# Patient Record
Sex: Male | Born: 1995 | Race: Black or African American | Hispanic: No | Smoking: Never smoker
Health system: Southern US, Community
[De-identification: ages and names within clinical notes are randomized; demographics above are authoritative.]

## PROBLEM LIST (undated history)

## (undated) HISTORY — PX: NO PAST SURGERIES: SHX2092

---
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
## 2019-02-23 ENCOUNTER — Ambulatory Visit (INDEPENDENT_AMBULATORY_CARE_PROVIDER_SITE_OTHER): Payer: BC Managed Care – PPO | Admitting: Family Medicine

## 2019-02-23 ENCOUNTER — Other Ambulatory Visit: Payer: Self-pay

## 2019-02-23 ENCOUNTER — Encounter: Payer: Self-pay | Admitting: Family Medicine

## 2019-02-23 VITALS — BP 120/90 | HR 61 | Temp 97.5°F | Ht 70.5 in | Wt 183.6 lb

## 2019-02-23 DIAGNOSIS — F4321 Adjustment disorder with depressed mood: Secondary | ICD-10-CM | POA: Insufficient documentation

## 2019-02-23 DIAGNOSIS — Z113 Encounter for screening for infections with a predominantly sexual mode of transmission: Secondary | ICD-10-CM

## 2019-02-23 DIAGNOSIS — Z0000 Encounter for general adult medical examination without abnormal findings: Secondary | ICD-10-CM

## 2019-02-23 DIAGNOSIS — Z13220 Encounter for screening for lipoid disorders: Secondary | ICD-10-CM

## 2019-02-23 DIAGNOSIS — G479 Sleep disorder, unspecified: Secondary | ICD-10-CM | POA: Insufficient documentation

## 2019-02-23 DIAGNOSIS — Z1329 Encounter for screening for other suspected endocrine disorder: Secondary | ICD-10-CM | POA: Diagnosis not present

## 2019-02-23 NOTE — Patient Instructions (Addendum)
It was a pleasure meeting you today.  Try over-the-counter melatonin as opposed to other sleep aids. Make sure the room you sleep and is dark, no lights on and cool. Try to avoid watching TV or reading your phone while laying in bed. If you go to bed and cannot fall asleep within 30 minutes then I recommend that you get up and do something until you are sleepy. Try to avoid caffeine or exercise right before bedtime  I recommend that you schedule with a therapist. You may call and schedule with anyone but make sure they are in your insurance network.  Try to eat small frequent meals and avoid skipping meals. Stay well-hydrated.  Start exercising again.  It is recommended that you get at least 150 minutes of vigorous physical activity each week.  We will be in touch with your lab results.  4-week follow-up virtually or in office.  You can call to schedule your appointment with a counselor. A few offices are listed below for you to call.   [facility] [address] [place], [place] [address]  Phone: [place]  (across from [facility])  [facility] [address] [place], [place] [address] [place], [place],  [address]  Phone: [phone]       Preventive Care 59-66 Years Old, Male Preventive care refers to lifestyle choices and visits with your health care provider that can promote health and wellness. This includes:  A yearly physical exam. This is also called an annual well check.  Regular dental and eye exams.  Immunizations.  Screening for certain conditions.  Healthy lifestyle choices, such as eating a healthy diet, getting regular exercise, not using drugs or products that contain nicotine and tobacco, and limiting alcohol use.  What can I expect for my preventive care visit? Physical exam Your health care provider will check:  Height and weight. These may be used to calculate body mass index (BMI), which is a measurement that tells if you are at a healthy weight.  Heart rate and blood pressure.  Your skin for abnormal spots. Counseling Your health care provider may ask you questions about:  Alcohol, tobacco, and drug use.  Emotional well-being.  Home and relationship well-being.  Sexual activity.  Eating habits.  Work and work Statistician. What immunizations do I need?  Influenza (flu) vaccine  This is recommended every year. Tetanus, diphtheria, and pertussis (Tdap) vaccine  You may need a Td booster every 10 years. Varicella (chickenpox) vaccine  You may need this vaccine if you have not already been vaccinated. Human papillomavirus (HPV) vaccine  If recommended by your health care provider, you may need three doses over 6 months. Measles, mumps, and rubella (MMR) vaccine  You may need at least one dose of MMR. You may also need a second dose. Meningococcal conjugate (MenACWY) vaccine  One dose is recommended if you are 27-75 years old and a Market researcher living in a residence hall, or if you have one of several medical conditions. You may also need additional booster doses. Pneumococcal conjugate (PCV13) vaccine  You may need this if you have certain conditions and were not previously vaccinated. Pneumococcal polysaccharide (PPSV23) vaccine  You may need one or two doses if you smoke cigarettes or if  you have certain conditions. Hepatitis A vaccine  You may need this if you have certain conditions or if you travel or work in places where you may be exposed to hepatitis A. Hepatitis B vaccine  You may need this if you have certain conditions or if you travel or work in places where you may be exposed to hepatitis B. Haemophilus influenzae type b (Hib) vaccine  You may  need this if you have certain risk factors. You may receive vaccines as individual doses or as more than one vaccine together in one shot (combination vaccines). Talk with your health care provider about the risks and benefits of combination vaccines. What tests do I need? Blood tests  Lipid and cholesterol levels. These may be checked every 5 years starting at age 61.  Hepatitis C test.  Hepatitis B test. Screening   Diabetes screening. This is done by checking your blood sugar (glucose) after you have not eaten for a while (fasting).  Sexually transmitted disease (STD) testing. Talk with your health care provider about your test results, treatment options, and if necessary, the need for more tests. Follow these instructions at home: Eating and drinking   Eat a diet that includes fresh fruits and vegetables, whole grains, lean protein, and low-fat dairy products.  Take vitamin and mineral supplements as recommended by your health care provider.  Do not drink alcohol if your health care provider tells you not to drink.  If you drink alcohol: ? Limit how much you have to 0-2 drinks a day. ? Be aware of how much alcohol is in your drink. In the [place] one drink equals one 12 oz bottle of beer (355 mL), one 5 oz glass of wine (148 mL), or one 1 oz glass of hard liquor (44 mL). Lifestyle  Take daily care of your teeth and gums.  Stay active. Exercise for at least 30 minutes on 5 or more days each week.  Do not use any products that contain nicotine or tobacco, such as cigarettes, e-cigarettes, and chewing tobacco. If you need help quitting, ask your health care provider.  If you are sexually active, practice safe sex. Use a condom or other form of protection to prevent STIs (sexually transmitted infections). What's next?  Go to your health care provider once a year for a well check visit.  Ask your health care provider how often you should have your eyes and teeth checked.   Stay up to date on all vaccines. This information is not intended to replace advice given to you by your health care provider. Make sure you discuss any questions you have with your health care provider. Document Released: 04/29/2001 Document Revised: 02/25/2018 Document Reviewed: 02/25/2018 Elsevier Patient Education  2020 [employer].

## 2019-02-23 NOTE — Progress Notes (Signed)
Subjective:    Patient ID: Earl Allen, male    DOB: 1996/01/18, 23 y.o.   MRN: [id number]  HPI Chief Complaint  Patient presents with  . new pt    new pt, cpe. sometimes wake up depressed and having nightmares    He is new to the practice and here for a complete physical exam. Moved from [place]. Previously was in college at [employer] and played football there.  States he had sports physicals annually.   States he talks loudly and his girlfriend would like for him to have his hearing checked. He denies any issues with hearing.   States he started a new job right after college and did not like it. 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 Switched shifts every 2 eweks. States he was sleep deprived regularly.  He left that job in August. 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 Still has long hours and continues having bad days. States he has days where he feels depressed.  Still has issues sleeping some nights and takes "sleep aids".   He has not been involved in counseling.  Denies SI.  Denies self medicating with alcohol or drugs.  No history of addiction.   States he has not had any regular exercise since finishing college and he misses this. Diet is not very healthy. Fast food often.    He has a 13 month old and a dog.    Social history: Lives with girlfriend who is staring nursing school and his 74 month old son. His family lives in [place].  Denies smoking and drug use.   Immunizations: Tdap UTD. Declines flu shot   Health maintenance:  Last Dental Exam: last week  Last Eye Exam: March 2020. Wears glasses   Depression screen PHQ 2/9 02/23/2019  Decreased Interest 2  Down, Depressed, Hopeless 2  PHQ - 2 Score 4  Altered sleeping 3  Tired, decreased energy 3  Change in appetite 3  Feeling bad or failure about yourself  3  Trouble concentrating 3  Moving slowly or fidgety/restless 2   Suicidal thoughts 0  PHQ-9 Score 21  Difficult doing work/chores Somewhat difficult     Wears seatbelt always, smoke detectors in home and functioning, does not text while driving, feels safe in home environment.  Reviewed allergies, medications, past medical, surgical, family, and social history.   Review of Systems Review of Systems Constitutional: -fever, -chills, -sweats, -unexpected weight change,-fatigue ENT: -runny nose, -ear pain, -sore throat Cardiology:  -chest pain, -palpitations, -edema Respiratory: -cough, -shortness of breath, -wheezing Gastroenterology: -abdominal pain, -nausea, -vomiting, -diarrhea, -constipation  Hematology: -bleeding or bruising problems Musculoskeletal: -arthralgias, -myalgias, -joint swelling, -back pain Ophthalmology: -vision changes Urology: -dysuria, -difficulty urinating, -hematuria, -urinary frequency, -urgency Neurology: -headache, -weakness, -tingling, -numbness       Objective:   Physical Exam BP 120/90   Pulse 61   Temp (!) 97.5 F (36.4 C)   Ht 5' 10.5" (1.791 m)   Wt 183 lb 9.6 oz (83.3 kg)   BMI 25.97 kg/m   General Appearance:    Alert, cooperative, no distress, appears stated age  Head:    Normocephalic, without obvious abnormality, atraumatic  Eyes:    PERRL, conjunctiva/corneas clear, EOM's intact  Ears:    Normal TM's and external ear canals  Nose:   Mask in place   Throat:   Mask in place   Neck:   Supple, no lymphadenopathy;  thyroid:  no   enlargement/tenderness/nodules  Back:    Spine nontender, no curvature, ROM normal, no CVA     tenderness  Lungs:     Clear to auscultation bilaterally without wheezes, rales or     ronchi; respirations unlabored  Chest Wall:    No tenderness or deformity   Heart:    Regular rate and rhythm, S1 and S2 normal, no murmur, rub   or gallop  Breast Exam:    No chest wall tenderness, masses or gynecomastia  Abdomen:     Soft, non-tender, nondistended, normoactive bowel sounds,     no masses, no hepatosplenomegaly  Genitalia:    Normal male external genitalia without lesions.  Testicles without masses.  No inguinal hernias.  Rectal:   Deferred due to age <40 and lack of symptoms  Extremities:   No clubbing, cyanosis or edema  Pulses:   2+ and symmetric all extremities  Skin:   Skin color, texture, turgor normal, no rashes or lesions  Lymph nodes:   Cervical, supraclavicular, and axillary nodes normal  Neurologic:   CNII-XII intact, normal strength, sensation and gait; reflexes 2+ and symmetric throughout          Psych:   Normal mood, affect, hygiene and grooming.         Assessment & Plan:  Routine general medical examination at a health care facility - Plan: CBC with Differential, Comprehensive metabolic panel, TSH, T4, Free, Lipid Panel -He is new to the practice and here for a fasting CPE.  Discussed preventive healthcare including regular testicular exams.  He has no significant past medical or surgical history.  Immunizations reviewed he declines flu shot.  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  He does report enjoying his current job more than his last one at the detention center but he does work long hours and reports being stressed and having difficulty sleeping.  He denies SI.  Recommend getting involved in counseling.  Also recommend that he do some thinking about his next career move and what he would need to do in order to achieve this.  Sleep disturbance-recommend he avoid sleep aids that leave him feeling drowsy next morning.  He may try melatonin.  Also recommend discussing sleep issues with a counselor.  He is aware that he should call and schedule this.  Screen for STD (sexually transmitted disease) - Plan: RPR, HIV antibody (with reflex), GC/Chlamydia Probe Amp, Trichomonas vaginalis, RNA   Screening for thyroid disorder - Plan: TSH, T4, Free  Screening for lipid disorders - Plan: Lipid Panel

## 2019-02-24 LAB — CBC WITH DIFFERENTIAL/PLATELET
Basophils Absolute: 0 10*3/uL (ref 0.0–0.2)
Basos: 1 %
EOS (ABSOLUTE): 0.1 10*3/uL (ref 0.0–0.4)
Eos: 2 %
Hematocrit: 46.2 % (ref 37.5–51.0)
Hemoglobin: 16 g/dL (ref 13.0–17.7)
Immature Grans (Abs): 0 10*3/uL (ref 0.0–0.1)
Immature Granulocytes: 0 %
Lymphocytes Absolute: 1.4 10*3/uL (ref 0.7–3.1)
Lymphs: 31 %
MCH: 32.1 pg (ref 26.6–33.0)
MCHC: 34.6 g/dL (ref 31.5–35.7)
MCV: 93 fL (ref 79–97)
Monocytes Absolute: 0.3 10*3/uL (ref 0.1–0.9)
Monocytes: 7 %
Neutrophils Absolute: 2.6 10*3/uL (ref 1.4–7.0)
Neutrophils: 59 %
Platelets: 264 10*3/uL (ref 150–450)
RBC: 4.98 x10E6/uL (ref 4.14–5.80)
RDW: 12 % (ref 11.6–15.4)
WBC: 4.5 10*3/uL (ref 3.4–10.8)

## 2019-02-24 LAB — COMPREHENSIVE METABOLIC PANEL
ALT: 37 IU/L (ref 0–44)
AST: 66 IU/L — ABNORMAL HIGH (ref 0–40)
Albumin/Globulin Ratio: 2 (ref 1.2–2.2)
Albumin: 4.9 g/dL (ref 4.1–5.2)
Alkaline Phosphatase: 49 IU/L (ref 39–117)
BUN/Creatinine Ratio: 12 (ref 9–20)
BUN: 15 mg/dL (ref 6–20)
Bilirubin Total: 0.9 mg/dL (ref 0.0–1.2)
CO2: 24 mmol/L (ref 20–29)
Calcium: 10.6 mg/dL — ABNORMAL HIGH (ref 8.7–10.2)
Chloride: 101 mmol/L (ref 96–106)
Creatinine, Ser: 1.26 mg/dL (ref 0.76–1.27)
GFR calc Af Amer: 93 mL/min/{1.73_m2} (ref >59)
GFR calc non Af Amer: 80 mL/min/{1.73_m2} (ref >59)
Globulin, Total: 2.5 g/dL (ref 1.5–4.5)
Glucose: 83 mg/dL (ref 65–99)
Potassium: 4.1 mmol/L (ref 3.5–5.2)
Sodium: 140 mmol/L (ref 134–144)
Total Protein: 7.4 g/dL (ref 6.0–8.5)

## 2019-02-24 LAB — T4, FREE: Free T4: 1.22 ng/dL (ref 0.82–1.77)

## 2019-02-24 LAB — LIPID PANEL
Chol/HDL Ratio: 3 ratio (ref 0.0–5.0)
Cholesterol, Total: 179 mg/dL (ref 100–199)
HDL: 60 mg/dL (ref >39)
LDL Chol Calc (NIH): 103 mg/dL — ABNORMAL HIGH (ref 0–99)
Triglycerides: 86 mg/dL (ref 0–149)
VLDL Cholesterol Cal: 16 mg/dL (ref 5–40)

## 2019-02-24 LAB — HIV ANTIBODY (ROUTINE TESTING W REFLEX): HIV Screen 4th Generation wRfx: NONREACTIVE

## 2019-02-24 LAB — RPR: RPR Ser Ql: NONREACTIVE

## 2019-02-24 LAB — TSH: TSH: 0.496 u[IU]/mL (ref 0.450–4.500)

## 2019-02-25 LAB — GC/CHLAMYDIA PROBE AMP
Chlamydia trachomatis, NAA: NEGATIVE
Neisseria Gonorrhoeae by PCR: NEGATIVE

## 2019-02-25 LAB — TRICHOMONAS VAGINALIS, PROBE AMP: Trich vag by NAA: NEGATIVE

## 2019-02-28 ENCOUNTER — Other Ambulatory Visit: Payer: Self-pay | Admitting: Family Medicine

## 2019-02-28 DIAGNOSIS — R7401 Elevation of levels of liver transaminase levels: Secondary | ICD-10-CM

## 2019-02-28 NOTE — Progress Notes (Signed)
Let him know that one of his liver enzymes is mildly elevated and no sign of acute hepatitis. I recommend we recheck his liver function in 2-3 weeks. This can be a NON-fasting lab visit. I recommend he stop all alcohol use and NSAIDs such as ibuprofen, Aleve, Advil etc. His calcium level is also a little elevated and we will recheck this. His LDL or bad cholesterol is a little high too. I recommend that he drink at least 64 ounces of water throughout the day (not at one time) and cut back on fried food, fatty foods. Increase his physical activity as we discussed. Otherwise his blood work is ok and he is negative for STDs.

## 2019-03-18 LAB — SPECIMEN STATUS REPORT

## 2019-03-18 LAB — HEPATITIS PANEL, ACUTE
Hep A IgM: NEGATIVE
Hep B C IgM: NEGATIVE
Hep C Virus Ab: <0.1 s/co ratio (ref 0.0–0.9)
Hepatitis B Surface Ag: NEGATIVE

## 2019-03-22 ENCOUNTER — Other Ambulatory Visit: Payer: Self-pay

## 2019-03-22 ENCOUNTER — Other Ambulatory Visit (INDEPENDENT_AMBULATORY_CARE_PROVIDER_SITE_OTHER): Payer: BC Managed Care – PPO

## 2019-03-22 DIAGNOSIS — R7401 Elevation of levels of liver transaminase levels: Secondary | ICD-10-CM

## 2019-03-23 ENCOUNTER — Encounter: Payer: Self-pay | Admitting: Family Medicine

## 2019-03-23 ENCOUNTER — Ambulatory Visit (INDEPENDENT_AMBULATORY_CARE_PROVIDER_SITE_OTHER): Payer: BC Managed Care – PPO | Admitting: Family Medicine

## 2019-03-23 VITALS — Wt 185.0 lb

## 2019-03-23 DIAGNOSIS — F4321 Adjustment disorder with depressed mood: Secondary | ICD-10-CM

## 2019-03-23 LAB — COMPREHENSIVE METABOLIC PANEL
ALT: 22 IU/L (ref 0–44)
AST: 17 IU/L (ref 0–40)
Albumin/Globulin Ratio: 2 (ref 1.2–2.2)
Albumin: 4.8 g/dL (ref 4.1–5.2)
Alkaline Phosphatase: 49 IU/L (ref 39–117)
BUN/Creatinine Ratio: 15 (ref 9–20)
BUN: 18 mg/dL (ref 6–20)
Bilirubin Total: 0.4 mg/dL (ref 0.0–1.2)
CO2: 24 mmol/L (ref 20–29)
Calcium: 10.4 mg/dL — ABNORMAL HIGH (ref 8.7–10.2)
Chloride: 102 mmol/L (ref 96–106)
Creatinine, Ser: 1.22 mg/dL (ref 0.76–1.27)
GFR calc Af Amer: 97 mL/min/{1.73_m2} (ref >59)
GFR calc non Af Amer: 84 mL/min/{1.73_m2} (ref >59)
Globulin, Total: 2.4 g/dL (ref 1.5–4.5)
Glucose: 86 mg/dL (ref 65–99)
Potassium: 4.2 mmol/L (ref 3.5–5.2)
Sodium: 141 mmol/L (ref 134–144)
Total Protein: 7.2 g/dL (ref 6.0–8.5)

## 2019-03-23 NOTE — Progress Notes (Signed)
   Subjective:  Documentation for virtual audio and video telecommunications through [employer] encounter:  The patient was located at home. 2 patient identifiers used.  The provider was located in the office. The patient did consent to this visit and is aware of possible charges through their insurance for this visit.  The other persons participating in this telemedicine service were none.    Patient ID: Earl Allen, male    DOB: [birth year], 24 y.o.   MRN: [id number]  HPI Chief Complaint  Patient presents with  . depression    depression.    This is a 4-wk follow up on depression and sleep issues.  States he is doing much better.  He has been exercising and stopped alcohol use.  He is still interested in scheduling with a therapist but has not yet. States he started taking a multivitamin.  No new concerns today.  Denies fever, chills, headache, chest pain, palpitations, shortness of breath, abdominal pain, nausea, vomiting, diarrhea.   Review of Systems Pertinent positives and negatives in the history of present illness.     Objective:   Physical Exam Wt 185 lb (83.9 kg)   BMI 26.17 kg/m   Alert and oriented and in no acute distress.  Normal speech, mood and thought process      Assessment & Plan:  Situational depression  He seems to be doing much better and is taking good care of himself.  I congratulated him on exercising and avoiding alcohol.  His AST was elevated at his initial visit but we rechecked this and it was back in normal range.  His calcium level was also elevated and closer to normal after repeat labs.  Recommend he continue on a multivitamin and exercising.  I do recommend that he schedule with a therapist.  I [facility] recommended [facility] or [facility] [phone].  He declines medication. He will follow-up in 3 months for recheck of calcium and depression or sooner if needed.  Time spent on call was 11  minutes and in review of previous records 15 minutes total.  This virtual service is not related to other E/M service within previous 7 days.

## 2019-06-27 ENCOUNTER — Ambulatory Visit: Payer: BC Managed Care – PPO

## 2019-06-27 ENCOUNTER — Ambulatory Visit: Payer: BC Managed Care – PPO | Admitting: Podiatry

## 2019-06-27 ENCOUNTER — Other Ambulatory Visit: Payer: Self-pay | Admitting: Podiatry

## 2019-06-27 ENCOUNTER — Ambulatory Visit (INDEPENDENT_AMBULATORY_CARE_PROVIDER_SITE_OTHER): Payer: BC Managed Care – PPO

## 2019-06-27 ENCOUNTER — Other Ambulatory Visit: Payer: Self-pay

## 2019-06-27 DIAGNOSIS — S82845A Nondisplaced bimalleolar fracture of left lower leg, initial encounter for closed fracture: Secondary | ICD-10-CM

## 2019-06-27 DIAGNOSIS — M25572 Pain in left ankle and joints of left foot: Secondary | ICD-10-CM

## 2019-06-27 MED ORDER — DICLOFENAC SODIUM 75 MG PO TBEC: 75.0000 mg | DELAYED_RELEASE_TABLET | Freq: Two times a day (BID) | ORAL | 1 refills | Status: DC

## 2019-06-28 ENCOUNTER — Telehealth: Payer: Self-pay

## 2019-06-28 ENCOUNTER — Telehealth: Payer: Self-pay | Admitting: *Deleted

## 2019-06-28 DIAGNOSIS — S82899A Other fracture of unspecified lower leg, initial encounter for closed fracture: Secondary | ICD-10-CM

## 2019-06-28 NOTE — Telephone Encounter (Signed)
Orders to Dr. Logan Bores' CMA for pre-cert, faxed to [facility].

## 2019-06-28 NOTE — Telephone Encounter (Signed)
Prior Authorization for MR Left Ankle obtained through [facility] online. Order #  [id number]. Faxed to [facility].

## 2019-06-28 NOTE — Telephone Encounter (Signed)
-----   Message from Felecia Shelling, DPM sent at 06/27/2019 10:46 AM EDT ----- Regarding: MRI left ankle Please order MRI left ankle w/out contrast.   Dx: chronic ankle fracture posterior talus. Neuritis. Possible PT tendon tear LT  Thanks, Dr. Logan Bores

## 2019-07-05 NOTE — Progress Notes (Signed)
   HPI: 24 y.o. male presenting today 24 year old male otherwise very healthy presents the office today for evaluation of left ankle pain.  Patient does have a history of an ankle sprain back in 2016 and recurrent sprain back in 2019.  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  He continues to experience pain and tenderness to the left ankle.  He has a shooting sensation that goes up his leg as well.  Aggravated by running and pressure.  He has been icing and doing physical therapy.  No past medical history on file.   Physical Exam: General: The patient is alert and oriented x3 in no acute distress.  Dermatology: Skin is warm, dry and supple bilateral lower extremities. Negative for open lesions or macerations.  Vascular: Palpable pedal pulses bilaterally. No edema or erythema noted. Capillary refill within normal limits.  Neurological: Epicritic and protective threshold grossly intact bilaterally.   Musculoskeletal Exam: Range of motion within normal limits to all pedal and ankle joints bilateral. Muscle strength 5/5 in all groups bilateral.  Pain on palpation noted to the anterior medial and lateral aspect of the patient's left ankle joint.  Pain with range of motion also noted at.  Radiographic Exam:  Normal osseous mineralization. Joint spaces preserved. No fracture/dislocation/boney destruction.    Assessment: 1. H/o left ankle sprain-2016 2. H/o recurrent left ankle sprain-2019 3.  Likely ruptured ATFL left ankle   Plan of Care:  1. Patient evaluated. X-Rays reviewed.  2.  The patient has had multiple recurrent injuries with consistent pain over the last year and a half.  Today we are going to order an MRI to determine the extent of the recurrent ankle sprains and damage to the ankle joint and associated ligaments 3.  Prescription for diclofenac  75 mg 2 times daily 4.  Return to clinic in 3 weeks to review MRI results  *Played college football at [employer]      Felecia Shelling, [place] [facility]  Dr. Felecia Shelling, DPM    [address] [place], [place] [address]                Office [phone]  Fax [phone]

## 2019-07-20 ENCOUNTER — Ambulatory Visit: Payer: BC Managed Care – PPO | Admitting: Podiatry

## 2019-07-22 ENCOUNTER — Ambulatory Visit
Admission: RE | Admit: 2019-07-22 | Discharge: 2019-07-22 | Disposition: A | Discharge status: Home / Self Care | Payer: BC Managed Care – PPO | Source: Ambulatory Visit | Attending: Podiatry | Admitting: Podiatry

## 2019-07-22 ENCOUNTER — Other Ambulatory Visit: Payer: Self-pay

## 2019-08-01 ENCOUNTER — Ambulatory Visit: Payer: BC Managed Care – PPO | Admitting: Podiatry

## 2019-08-01 ENCOUNTER — Encounter: Payer: Self-pay | Admitting: Podiatry

## 2019-08-01 ENCOUNTER — Other Ambulatory Visit: Payer: Self-pay

## 2019-08-01 DIAGNOSIS — M25572 Pain in left ankle and joints of left foot: Secondary | ICD-10-CM

## 2019-08-01 DIAGNOSIS — S82899A Other fracture of unspecified lower leg, initial encounter for closed fracture: Secondary | ICD-10-CM | POA: Diagnosis not present

## 2019-08-01 DIAGNOSIS — S92132K Displaced fracture of posterior process of left talus, subsequent encounter for fracture with nonunion: Secondary | ICD-10-CM

## 2019-08-01 NOTE — Patient Instructions (Signed)
Pre-Operative Instructions  Congratulations, you have decided to take an important step towards improving your quality of life.  You can be assured that the doctors and staff at [facility] will be with you every step of the way.  Here are some important things you should know:  1. Plan to be at the surgery center/hospital at least 1 (one) hour prior to your scheduled time, unless otherwise directed by the surgical center/hospital staff.  You must have a responsible adult accompany you, remain during the surgery and drive you home.  Make sure you have directions to the surgical center/hospital to ensure you arrive on time. 2. If you are having surgery at [facility] or [facility] hospitals, you will need a copy of your medical history and physical form from your family physician within one month prior to the date of surgery. We will give you a form for your primary physician to complete.  3. We make every effort to accommodate the date you request for surgery.  However, there are times where surgery dates or times have to be moved.  We will contact you as soon as possible if a change in schedule is required.   4. No aspirin/ibuprofen for one week before surgery.  If you are on aspirin, any non-steroidal anti-inflammatory medications (Mobic, Aleve, Ibuprofen) should not be taken seven (7) days prior to your surgery.  You make take Tylenol for pain prior to surgery.  5. Medications - If you are taking daily heart and blood pressure medications, seizure, reflux, allergy, asthma, anxiety, pain or diabetes medications, make sure you notify the surgery center/hospital before the day of surgery so they can tell you which medications you should take or avoid the day of surgery. 6. No food or drink after midnight the night before surgery unless directed otherwise by surgical center/hospital staff. 7. No alcoholic beverages 24-hours prior to surgery.  No smoking 24-hours prior or 24-hours after  surgery. 8. Wear loose pants or shorts. They should be loose enough to fit over bandages, boots, and casts. 9. Don't wear slip-on shoes. Sneakers are preferred. 10. Bring your boot with you to the surgery center/hospital.  Also bring crutches or a walker if your physician has prescribed it for you.  If you do not have this equipment, it will be provided for you after surgery. 11. If you have not been contacted by the surgery center/hospital by the day before your surgery, call to confirm the date and time of your surgery. 12. Leave-time from work may vary depending on the type of surgery you have.  Appropriate arrangements should be made prior to surgery with your employer. 13. Prescriptions will be provided immediately following surgery by your doctor.  Fill these as soon as possible after surgery and take the medication as directed. Pain medications will not be refilled on weekends and must be approved by the doctor. 14. Remove nail polish on the operative foot and avoid getting pedicures prior to surgery. 15. Wash the night before surgery.  The night before surgery wash the foot and leg well with water and the antibacterial soap provided. Be sure to pay special attention to beneath the toenails and in between the toes.  Wash for at least three (3) minutes. Rinse thoroughly with water and dry well with a towel.  Perform this wash unless told not to do so by your physician.  Enclosed: 1 Ice pack (please put in freezer the night before surgery)   1 Hibiclens skin cleaner     Pre-op instructions  If you have any questions regarding the instructions, please do not hesitate to call our office.  [place]: [address], [place], [place] [address] -- [phone]  [place]: [address] [place], [place] [address] -- [phone]  [place]: [address], [place], [place] [address] -- [phone]   Website: [email or web address] 

## 2019-08-02 ENCOUNTER — Telehealth: Payer: Self-pay

## 2019-08-02 NOTE — Telephone Encounter (Signed)
DOS 08/11/2019  TARSAL EXOSTECTOMY LT - 28104   BCBS EFFECTIVE DATE - 04/17/2019    In-Network   Max Per Benefit Period Year-to-Date Remaining     CoInsurance         Deductible $5,500.00 $5,493.98     Out-Of-Pocket 3 $6,850.00 $3,086.57  Copay Not Applicable Coinsurance 25%  per  Service Year Authorization Required No

## 2019-08-04 NOTE — Progress Notes (Signed)
   HPI: 24 y.o. male presenting today for follow up evaluation of left ankle pain. He states he is doing well overall. He reports some improvement in the pain since his last visit. He has been taking Diclofenac as directed. He had an MRI done of the ankle on 07/22/2019 and is here to get the results. Patient is here for further evaluation and treatment.  No past medical history on file.   Physical Exam: General: The patient is alert and oriented x3 in no acute distress.  Dermatology: Skin is warm, dry and supple bilateral lower extremities. Negative for open lesions or macerations.  Vascular: Palpable pedal pulses bilaterally. No edema or erythema noted. Capillary refill within normal limits.  Neurological: Epicritic and protective threshold grossly intact bilaterally.   Musculoskeletal Exam: Pain with palpation noted to the medial aspect of the left ankle correlating with MRI findings.   MRI Impression done on 07/22/2019:  1. 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  Assessment: 1. Shepard's fracture / os trigonum left posterior ankle    Plan of Care:  1. Patient evaluated. MRI reviewed.  2. Today we discussed the conservative versus surgical management of the presenting pathology. The patient opts for surgical management. All possible complications and details of the procedure were explained. All patient questions were answered. No guarantees were expressed or implied. 3. Authorization for surgery was initiated today. Surgery will consist of excision of os trigonum / Shepard's fracture left ankle through medial incision.  4. Return to clinic one week post op.   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      Felecia Shelling, DPM [facility]  Dr. Felecia Shelling, DPM    [address] [place], [place] [address]                Office  [phone]  Fax [phone]

## 2019-08-09 ENCOUNTER — Telehealth: Payer: Self-pay

## 2019-08-09 NOTE — Telephone Encounter (Signed)
Danzel called to cancel his surgery for 08/11/19. He stated he will call back to reschedule. Left message for Renee at [facility].

## 2019-08-17 ENCOUNTER — Encounter: Payer: BC Managed Care – PPO | Admitting: Podiatry

## 2019-08-24 ENCOUNTER — Encounter: Payer: BC Managed Care – PPO | Admitting: Podiatry

## 2019-09-07 ENCOUNTER — Encounter: Payer: BC Managed Care – PPO | Admitting: Podiatry

## 2020-10-12 ENCOUNTER — Ambulatory Visit (INDEPENDENT_AMBULATORY_CARE_PROVIDER_SITE_OTHER): Payer: No Typology Code available for payment source | Admitting: Urology

## 2020-10-12 ENCOUNTER — Other Ambulatory Visit: Payer: Self-pay

## 2020-10-12 ENCOUNTER — Encounter: Payer: Self-pay | Admitting: Urology

## 2020-10-12 VITALS — BP 176/84 | HR 66 | Ht 71.0 in | Wt 195.0 lb

## 2020-10-12 DIAGNOSIS — N478 Other disorders of prepuce: Secondary | ICD-10-CM

## 2020-10-12 NOTE — Progress Notes (Signed)
   10/12/2020 10:19 AM   Earl Allen 12-12-1995 [id number]  Referring provider: Avanell Shackleton, NP-C [address] [place],  [place] [address]  Chief Complaint  Patient presents with   Circumcision    HPI: Earl Allen is a 25 y.o. male who presents for a circumcision evaluation.  Desires circumcision for cleanliness/hygienic reasons No history of balanitis No difficulty retracting foreskin No bothersome LUTS  PMH: History reviewed. No pertinent past medical history.  Surgical History: History reviewed. No pertinent surgical history.  Home Medications:  Allergies as of 10/12/2020   No Known Allergies      Medication List        Accurate as of October 12, 2020 10:19 AM. If you have any questions, ask your nurse or doctor.          diclofenac 75 MG EC tablet Commonly known as: VOLTAREN Take 1 tablet (75 mg total) by mouth 2 (two) times daily.        Allergies: No Known Allergies  Family History: History reviewed. No pertinent family history.  Social History:  reports that he has never smoked. He has quit using smokeless tobacco.  His smokeless tobacco use included chew. He reports current alcohol use. He reports that he does not use drugs.   Physical Exam: BP (!) 176/84   Pulse 66   Ht 5\' 11"  (1.803 m)   Wt 195 lb (88.5 kg)   BMI 27.20 kg/m   Constitutional:  Alert and oriented, No acute distress. HEENT: Earl Allen, moist mucus membranes.  Trachea midline, no masses. Cardiovascular: No clubbing, cyanosis, or edema. Respiratory: Normal respiratory effort, no increased work of breathing. GI: Abdomen is soft, nontender, nondistended, no abdominal masses GU: Phallus foreskin easily retracts, meatus normal.  Testes descended bilaterally without masses or tenderness Skin: No rashes, bruises or suspicious lesions. Neurologic: Grossly intact, no focal deficits, moving all 4 extremities. Psychiatric: Normal mood and affect.   Assessment & Plan:    1.   Redundant prepuce Desires circumcision The procedure was discussed in detail including potential risks of bleeding, infection, scarring Postop course was discussed and informed no intercourse/masturbation for 4 weeks postop   , MD  [facility] [address], [address] [place], [place] [phone]

## 2020-10-14 ENCOUNTER — Encounter: Payer: Self-pay | Admitting: Urology

## 2020-10-23 ENCOUNTER — Encounter (HOSPITAL_COMMUNITY): Payer: Self-pay | Admitting: Emergency Medicine

## 2020-10-23 ENCOUNTER — Ambulatory Visit (HOSPITAL_COMMUNITY)
Admission: EM | Admit: 2020-10-23 | Discharge: 2020-10-23 | Disposition: A | Discharge status: Home / Self Care | Payer: No Typology Code available for payment source | Attending: Family Medicine | Admitting: Family Medicine

## 2020-10-23 DIAGNOSIS — S61209A Unspecified open wound of unspecified finger without damage to nail, initial encounter: Secondary | ICD-10-CM

## 2020-10-23 DIAGNOSIS — S61207A Unspecified open wound of left little finger without damage to nail, initial encounter: Secondary | ICD-10-CM

## 2020-10-23 NOTE — ED Triage Notes (Signed)
Pt is present today with a laceration on the left pinky finger. Pt states that it happened around 3:30pm

## 2020-10-24 NOTE — ED Provider Notes (Signed)
  [facility]   [id number] 10/23/20 Arrival Time: 1913  ASSESSMENT & PLAN:  1. Avulsion of fingertip, initial encounter    No repair needed. Non-stick pressure dressing applied. To remove in the morning. May f/u here with any problems.   Reviewed expectations re: course of current medical issues. Questions answered. Outlined signs and symptoms indicating need for more acute intervention. Patient verbalized understanding. After Visit Summary given.   SUBJECTIVE:  Earl Allen is a 25 y.o. male who presents with a laceration of his LEFT 5th fingertip. Cut with knife; today. Trouble with continuous oozing. Some pain. No extremity sensation changes or weakness.   Td UTD: he feels so..   OBJECTIVE:  Vitals:   10/23/20 1939  BP: 126/65  Pulse: 65  Resp: 18  Temp: 98.2 F (36.8 C)  SpO2: 100%     General appearance: alert; no distress Skin: LEFT 5th fingertip avulsion without nail involvement; oozing blood Psychological: alert and cooperative; normal mood and affect    Labs Reviewed - No data to display  No results found.  No Known Allergies  History reviewed. No pertinent past medical history. Social History   Socioeconomic History   Marital status: Significant Other    Spouse name: Not on file   Number of children: Not on file   Years of education: Not on file   Highest education level: Not on file  Occupational History   Not on file  Tobacco Use   Smoking status: Never   Smokeless tobacco: Former    Types: Chew  Substance and Sexual Activity   Alcohol use: Yes    Comment: twice a week   Drug use: Never   Sexual activity: Yes    Partners: Female  Other Topics Concern   Not on file  Social History Narrative   Not on file   Social Determinants of Health   Financial Resource Strain: Not on file  Food Insecurity: Not on file  Transportation Needs: Not on file  Physical Activity: Not on file  Stress: Not on file  Social Connections: Not  on file          Mardella Layman, MD 10/24/20 [phone]

## 2020-10-25 ENCOUNTER — Other Ambulatory Visit: Payer: Self-pay

## 2020-10-25 ENCOUNTER — Telehealth: Payer: Self-pay

## 2020-10-25 DIAGNOSIS — N478 Other disorders of prepuce: Secondary | ICD-10-CM

## 2020-10-25 NOTE — Telephone Encounter (Signed)
Called patient and agreed on surgery date of 10/30/20 for a Circumcision with Dr. Lonna Cobb. Patient does not currently take any medications daily. No UA/CX needed. Surgical instructions given (NPO, Driver, arrival number). Patient verbalized understanding

## 2020-10-29 ENCOUNTER — Inpatient Hospital Stay: Admission: RE | Admit: 2020-10-29 | Payer: No Typology Code available for payment source | Source: Ambulatory Visit

## 2020-10-30 ENCOUNTER — Encounter: Admission: RE | Payer: Self-pay | Source: Home / Self Care

## 2020-10-30 ENCOUNTER — Ambulatory Visit
Admission: RE | Admit: 2020-10-30 | Payer: No Typology Code available for payment source | Source: Home / Self Care | Admitting: Urology

## 2020-10-30 SURGERY — CIRCUMCISION, ADULT
Anesthesia: General

## 2021-07-15 DIAGNOSIS — N478 Other disorders of prepuce: Secondary | ICD-10-CM

## 2021-07-15 HISTORY — DX: Other disorders of prepuce: N47.8

## 2021-08-01 ENCOUNTER — Telehealth: Payer: Self-pay | Admitting: Urology

## 2021-08-01 NOTE — Telephone Encounter (Signed)
Patient came in last year for a circumcision consult but did not have the procedure. He is ready to schedule the procedure, does he need another consult?

## 2021-08-02 ENCOUNTER — Other Ambulatory Visit: Payer: Self-pay | Admitting: Urology

## 2021-08-02 ENCOUNTER — Telehealth: Payer: Self-pay

## 2021-08-02 DIAGNOSIS — N478 Other disorders of prepuce: Secondary | ICD-10-CM

## 2021-08-02 NOTE — Telephone Encounter (Signed)
Called Patient, left message for patient to call to schedule surgery.

## 2021-08-02 NOTE — Progress Notes (Signed)
Surgical Physician Order Form [facility] [place]  * Scheduling expectation : Next Available  *Length of Case: 60-minute  *Clearance needed: no  *Anticoagulation Instructions: N/A  *Aspirin Instructions: N/A  *Post-op visit Date/Instructions:  1 month follow up with PA  *Diagnosis:  Redundant prepuce  *Procedure:  Circumcision [id number])   Additional orders: N/A  -Admit type: OUTpatient  -Anesthesia: Choice  -VTE Prophylaxis Standing Order SCD's       Other:   -Standing Lab Orders Per Anesthesia    Lab other: None  -Standing Test orders EKG/Chest x-ray per Anesthesia       Test other:   - Medications:  Ancef 2gm IV  -Other orders:  N/A

## 2021-08-05 ENCOUNTER — Telehealth: Payer: Self-pay

## 2021-08-05 NOTE — Progress Notes (Signed)
[facility] Posting Form   Surgery Date/Time: Date: 08/20/2021  Surgeon: Dr. Irineo Axon, MD  Surgery Location: Day Surgery  Inpt ( No  )   Outpt (Yes)   Obs ( No  )   Diagnosis: N47.8 Redundant Prepuce  -CPT: 54161  Surgery: Circumcision  Stop Anticoagulations: N/A  Cardiac/Medical/Pulmonary Clearance needed: no  *Orders entered into EPIC  Date: 08/05/21   *Case booked in [place]  Date: 08/02/2021  *Notified pt of Surgery: Date: 08/02/2021  PRE-OP UA & CX: no  *Placed into Prior Authorization Work Que Date: 08/05/21   Assistant/laser/rep:No

## 2021-08-05 NOTE — Telephone Encounter (Signed)
I spoke with Mr. Betancur. We have discussed possible surgery dates and Tuesday June 6th, 2023 was agreed upon by all parties. Patient given information about surgery date, what to expect pre-operatively and post operatively.  We discussed that a Pre-Admission Testing office will be calling to set up the pre-op visit that will take place prior to surgery, and that these appointments are typically done over the phone with a Pre-Admissions RN.  Informed patient that our office will communicate any additional care to be provided after surgery. Patients questions or concerns were discussed during our call. Advised to call our office should there be any additional information, questions or concerns that arise. Patient verbalized understanding.

## 2021-08-08 ENCOUNTER — Encounter
Admission: RE | Admit: 2021-08-08 | Discharge: 2021-08-08 | Disposition: A | Discharge status: Home / Self Care | Payer: Self-pay | Source: Ambulatory Visit | Attending: Urology | Admitting: Urology

## 2021-08-08 NOTE — Patient Instructions (Addendum)
Your procedure is scheduled on: Tuesday, June 6 Report to the Registration Desk on the 1st floor of the [facility] To find out your arrival time, please call [phone] between 1PM - 3PM on: Monday, June 5 If your arrival time is 6:00 am, do not arrive prior to that time as the [facility] entrance doors do not open until 6:00 am.  REMEMBER: Instructions that are not followed completely may result in serious medical risk, up to and including death; or upon the discretion of your surgeon and anesthesiologist your surgery may need to be rescheduled.  Do not eat or drink after midnight the night before surgery.  No gum chewing, lozengers or hard candies.  DO NOT TAKE ANY MEDICATIONS THE MORNING OF SURGERY   One week prior to surgery: starting May 30 Stop Anti-inflammatories (NSAIDS) such as Advil, Aleve, Ibuprofen, Motrin, Naproxen, Naprosyn and Aspirin based products such as Excedrin, Goodys Powder, BC Powder. Stop ANY OVER THE COUNTER supplements until after surgery. You may however, continue to take Tylenol if needed for pain up until the day of surgery.  No Alcohol for 24 hours before or after surgery.  No Smoking including e-cigarettes for 24 hours prior to surgery.  No chewable tobacco products for at least 6 hours prior to surgery.  No nicotine patches on the day of surgery.  Do not use any "recreational" drugs for at least a week prior to your surgery.  Please be advised that the combination of cocaine and anesthesia may have negative outcomes, up to and including death. If you test positive for cocaine, your surgery will be cancelled.  On the morning of surgery brush your teeth with toothpaste and water, you may rinse your mouth with mouthwash if you wish. Do not swallow any toothpaste or mouthwash.  Do not wear jewelry, make-up, hairpins, clips or nail polish.  Do not wear lotions, powders, or perfumes.   Do not shave body from the neck down 48 hours prior to surgery  just in case you cut yourself which could leave a site for infection.   Contact lenses, hearing aids and dentures may not be worn into surgery.  Do not bring valuables to the hospital. [facility] is not responsible for any missing/lost belongings or valuables.   Notify your doctor if there is any change in your medical condition (cold, fever, infection).  Wear comfortable clothing (specific to your surgery type) to the hospital.  After surgery, you can help prevent lung complications by doing breathing exercises.  Take deep breaths and cough every 1-2 hours. Your doctor may order a device called an Incentive Spirometer to help you take deep breaths.  If you are being discharged the day of surgery, you will not be allowed to drive home. You will need a responsible adult (18 years or older) to drive you home and stay with you that night.   If you are taking public transportation, you will need to have a responsible adult (18 years or older) with you. Please confirm with your physician that it is acceptable to use public transportation.   Please call the Pre-admissions Testing Dept. at [phone] if you have any questions about these instructions.  Surgery Visitation Policy:  Patients undergoing a surgery or procedure may have two family members or support persons with them as long as the person is not COVID-19 positive or experiencing its symptoms.

## 2021-08-19 MED ORDER — ORAL CARE MOUTH RINSE: 15.0000 mL | Freq: Once | OROMUCOSAL | Status: AC

## 2021-08-19 MED ORDER — CEFAZOLIN SODIUM-DEXTROSE 2-4 GM/100ML-% IV SOLN
2.0000 g | INTRAVENOUS | Status: AC
  Administered 2021-08-20: 2 g via INTRAVENOUS

## 2021-08-19 MED ORDER — LACTATED RINGERS IV SOLN: INTRAVENOUS | Status: DC

## 2021-08-19 MED ORDER — FAMOTIDINE 20 MG PO TABS: 20.0000 mg | ORAL_TABLET | Freq: Once | ORAL | Status: AC

## 2021-08-19 MED ORDER — CHLORHEXIDINE GLUCONATE 0.12 % MT SOLN
15.0000 mL | Freq: Once | OROMUCOSAL | Status: AC
Start: 2021-08-19 — End: 2021-08-20

## 2021-08-20 ENCOUNTER — Other Ambulatory Visit: Payer: Self-pay

## 2021-08-20 ENCOUNTER — Encounter
Admission: RE | Disposition: A | Discharge status: Home / Self Care | Payer: Self-pay | Source: Home / Self Care | Attending: Urology

## 2021-08-20 ENCOUNTER — Ambulatory Visit: Payer: PRIVATE HEALTH INSURANCE | Admitting: Anesthesiology

## 2021-08-20 ENCOUNTER — Encounter: Payer: Self-pay | Admitting: Urology

## 2021-08-20 ENCOUNTER — Ambulatory Visit
Admission: RE | Admit: 2021-08-20 | Discharge: 2021-08-20 | Disposition: A | Discharge status: Home / Self Care | Payer: PRIVATE HEALTH INSURANCE | Attending: Urology | Admitting: Urology

## 2021-08-20 DIAGNOSIS — N478 Other disorders of prepuce: Secondary | ICD-10-CM | POA: Diagnosis present

## 2021-08-20 HISTORY — PX: CIRCUMCISION: SHX1350

## 2021-08-20 SURGERY — CIRCUMCISION, ADULT
Anesthesia: General | Site: Penis

## 2021-08-20 MED ORDER — DEXAMETHASONE SODIUM PHOSPHATE 10 MG/ML IJ SOLN
INTRAMUSCULAR | Status: DC | PRN
  Administered 2021-08-20: 10 mg via INTRAVENOUS

## 2021-08-20 MED ORDER — BUPIVACAINE HCL (PF) 0.25 % IJ SOLN
INTRAMUSCULAR | Status: AC
  Filled 2021-08-20: qty 30

## 2021-08-20 MED ORDER — HYDROMORPHONE HCL 1 MG/ML IJ SOLN
INTRAMUSCULAR | Status: DC | PRN
  Administered 2021-08-20 (×2): .5 mg via INTRAVENOUS

## 2021-08-20 MED ORDER — MIDAZOLAM HCL 2 MG/2ML IJ SOLN
INTRAMUSCULAR | Status: DC | PRN
  Administered 2021-08-20: 2 mg via INTRAVENOUS

## 2021-08-20 MED ORDER — PROPOFOL 10 MG/ML IV BOLUS
INTRAVENOUS | Status: AC
  Filled 2021-08-20: qty 20

## 2021-08-20 MED ORDER — PROPOFOL 10 MG/ML IV BOLUS
INTRAVENOUS | Status: DC | PRN
  Administered 2021-08-20: 300 mg via INTRAVENOUS

## 2021-08-20 MED ORDER — BACITRACIN 500 UNIT/GM EX OINT
TOPICAL_OINTMENT | CUTANEOUS | Status: DC | PRN
  Administered 2021-08-20: 1 via TOPICAL

## 2021-08-20 MED ORDER — ACETAMINOPHEN 10 MG/ML IV SOLN
INTRAVENOUS | Status: DC | PRN
  Administered 2021-08-20: 1000 mg via INTRAVENOUS

## 2021-08-20 MED ORDER — CHLORHEXIDINE GLUCONATE 0.12 % MT SOLN
OROMUCOSAL | Status: AC
  Administered 2021-08-20: 15 mL via OROMUCOSAL
  Filled 2021-08-20: qty 15

## 2021-08-20 MED ORDER — FENTANYL CITRATE (PF) 100 MCG/2ML IJ SOLN
INTRAMUSCULAR | Status: DC | PRN
Start: 2021-08-20 — End: 2021-08-20
  Administered 2021-08-20 (×2): 50 ug via INTRAVENOUS

## 2021-08-20 MED ORDER — HYDROMORPHONE HCL 1 MG/ML IJ SOLN
INTRAMUSCULAR | Status: AC
  Filled 2021-08-20: qty 1

## 2021-08-20 MED ORDER — ACETAMINOPHEN 10 MG/ML IV SOLN
INTRAVENOUS | Status: AC
  Filled 2021-08-20: qty 100

## 2021-08-20 MED ORDER — CEFAZOLIN SODIUM-DEXTROSE 2-4 GM/100ML-% IV SOLN
INTRAVENOUS | Status: AC
  Filled 2021-08-20: qty 100

## 2021-08-20 MED ORDER — ONDANSETRON HCL 4 MG/2ML IJ SOLN
INTRAMUSCULAR | Status: DC | PRN
  Administered 2021-08-20: 4 mg via INTRAVENOUS

## 2021-08-20 MED ORDER — LIDOCAINE HCL (CARDIAC) PF 100 MG/5ML IV SOSY
PREFILLED_SYRINGE | INTRAVENOUS | Status: DC | PRN
  Administered 2021-08-20: 100 mg via INTRAVENOUS

## 2021-08-20 MED ORDER — FENTANYL CITRATE (PF) 100 MCG/2ML IJ SOLN
INTRAMUSCULAR | Status: AC
  Filled 2021-08-20: qty 2

## 2021-08-20 MED ORDER — LIDOCAINE HCL (PF) 2 % IJ SOLN
INTRAMUSCULAR | Status: AC
  Filled 2021-08-20: qty 5

## 2021-08-20 MED ORDER — BUPIVACAINE HCL 0.25 % IJ SOLN
INTRAMUSCULAR | Status: DC | PRN
  Administered 2021-08-20: 7 mL

## 2021-08-20 MED ORDER — 0.9 % SODIUM CHLORIDE (POUR BTL) OPTIME
TOPICAL | Status: DC | PRN
  Administered 2021-08-20: 500 mL

## 2021-08-20 MED ORDER — BACITRACIN ZINC 500 UNIT/GM EX OINT
TOPICAL_OINTMENT | CUTANEOUS | Status: AC
  Filled 2021-08-20: qty 28.35

## 2021-08-20 MED ORDER — FAMOTIDINE 20 MG PO TABS
ORAL_TABLET | ORAL | Status: AC
  Administered 2021-08-20: 20 mg via ORAL
  Filled 2021-08-20: qty 1

## 2021-08-20 MED ORDER — DEXMEDETOMIDINE (PRECEDEX) IN NS 20 MCG/5ML (4 MCG/ML) IV SYRINGE
PREFILLED_SYRINGE | INTRAVENOUS | Status: DC | PRN
  Administered 2021-08-20 (×3): 4 ug via INTRAVENOUS
  Administered 2021-08-20: 8 ug via INTRAVENOUS

## 2021-08-20 MED ORDER — ONDANSETRON HCL 4 MG/2ML IJ SOLN
INTRAMUSCULAR | Status: AC
  Filled 2021-08-20: qty 2

## 2021-08-20 MED ORDER — DEXAMETHASONE SODIUM PHOSPHATE 10 MG/ML IJ SOLN
INTRAMUSCULAR | Status: AC
  Filled 2021-08-20: qty 1

## 2021-08-20 MED ORDER — MIDAZOLAM HCL 2 MG/2ML IJ SOLN
INTRAMUSCULAR | Status: AC
  Filled 2021-08-20: qty 2

## 2021-08-20 MED ORDER — OXYCODONE-ACETAMINOPHEN 5-325 MG PO TABS: 1.0000 | ORAL_TABLET | Freq: Four times a day (QID) | ORAL | 0 refills | Status: DC | PRN

## 2021-08-20 SURGICAL SUPPLY — 28 items
APL PRP STRL LF DISP 70% ISPRP (MISCELLANEOUS) ×1
BLADE CLIPPER SURG (BLADE) ×2 IMPLANT
BLADE SURG 15 STRL LF DISP TIS (BLADE) ×1 IMPLANT
BLADE SURG 15 STRL SS (BLADE) ×2
BNDG COHESIVE 1X5 TAN NS LF (GAUZE/BANDAGES/DRESSINGS) IMPLANT
BNDG CONFORM 2 STRL LF (GAUZE/BANDAGES/DRESSINGS) ×2 IMPLANT
CHLORAPREP W/TINT 26 (MISCELLANEOUS) ×2 IMPLANT
CUP MEDICINE 2OZ PLAST GRAD ST (MISCELLANEOUS) ×2 IMPLANT
DRAPE LAPAROTOMY 77X122 PED (DRAPES) ×2 IMPLANT
ELECT REM PT RETURN 9FT ADLT (ELECTROSURGICAL) ×2
ELECTRODE REM PT RTRN 9FT ADLT (ELECTROSURGICAL) ×1 IMPLANT
GAUZE 4X4 16PLY ~~LOC~~+RFID DBL (SPONGE) ×2 IMPLANT
GAUZE PETROLATUM 1 X8 (GAUZE/BANDAGES/DRESSINGS) ×2 IMPLANT
GLOVE SURG UNDER POLY LF SZ7.5 (GLOVE) ×2 IMPLANT
GOWN STRL REUS W/ TWL LRG LVL3 (GOWN DISPOSABLE) ×1 IMPLANT
GOWN STRL REUS W/ TWL XL LVL3 (GOWN DISPOSABLE) ×1 IMPLANT
GOWN STRL REUS W/TWL LRG LVL3 (GOWN DISPOSABLE) ×2
GOWN STRL REUS W/TWL XL LVL3 (GOWN DISPOSABLE) ×2
KIT TURNOVER KIT A (KITS) ×2 IMPLANT
MANIFOLD NEPTUNE II (INSTRUMENTS) ×2 IMPLANT
NDL HYPO 25X1 1.5 SAFETY (NEEDLE) ×1 IMPLANT
NEEDLE HYPO 25X1 1.5 SAFETY (NEEDLE) ×2 IMPLANT
NS IRRIG 500ML POUR BTL (IV SOLUTION) ×2 IMPLANT
PACK BASIN MINOR ARMC (MISCELLANEOUS) ×2 IMPLANT
STRETCH NET 2 107126 (MISCELLANEOUS) ×2 IMPLANT
SUT CHROMIC 3 0 SH 27 (SUTURE) ×4 IMPLANT
SUT CHROMIC 4 0 RB 1X27 (SUTURE) ×2 IMPLANT
SYR 10ML LL (SYRINGE) ×2 IMPLANT

## 2021-08-20 NOTE — Anesthesia Procedure Notes (Signed)
Procedure Name: LMA Insertion Date/Time: 08/20/2021 8:49 AM Performed by: Tammi Klippel, CRNA Pre-anesthesia Checklist: Patient identified, Patient being monitored, Timeout performed, Emergency Drugs available and Suction available Patient Re-evaluated:Patient Re-evaluated prior to induction Oxygen Delivery Method: Circle system utilized Preoxygenation: Pre-oxygenation with 100% oxygen Induction Type: IV induction Ventilation: Mask ventilation without difficulty LMA: LMA inserted LMA Size: 5.0 Number of attempts: 1 Placement Confirmation: positive ETCO2 Tube secured with: Tape Dental Injury: Teeth and Oropharynx as per pre-operative assessment

## 2021-08-20 NOTE — Transfer of Care (Signed)
Immediate Anesthesia Transfer of Care Note  Patient: Earl Allen  Procedure(s) Performed: CIRCUMCISION ADULT (Penis)  Patient Location: PACU  Anesthesia Type:General  Level of Consciousness: drowsy  Airway & Oxygen Therapy: Patient Spontanous Breathing, Patient connected to face mask oxygen and oral airway in place, airway patent.  Post-op Assessment: Report given to RN and Post -op Vital signs reviewed and stable  Post vital signs: Reviewed and stable  Last Vitals:  Vitals Value Taken Time  BP 95/49 08/20/21 1015  Temp    Pulse 60 08/20/21 1015  Resp 12 08/20/21 1015  SpO2 100 % 08/20/21 1015    Last Pain:  Vitals:   08/20/21 0724  TempSrc: Temporal  PainSc: 0-No pain         Complications: No notable events documented.

## 2021-08-20 NOTE — Anesthesia Postprocedure Evaluation (Signed)
Anesthesia Post Note  Patient: Scientist, research (life sciences)  Procedure(s) Performed: CIRCUMCISION ADULT (Penis)  Patient location during evaluation: PACU Anesthesia Type: General Level of consciousness: awake and alert Pain management: pain level controlled Vital Signs Assessment: post-procedure vital signs reviewed and stable Respiratory status: spontaneous breathing, nonlabored ventilation, respiratory function stable and patient connected to nasal cannula oxygen Cardiovascular status: blood pressure returned to baseline and stable Postop Assessment: no apparent nausea or vomiting Anesthetic complications: no   No notable events documented.   Last Vitals:  Vitals:   08/20/21 1057 08/20/21 1109  BP: 123/62 134/70  Pulse: 87 71  Resp: 18 16  Temp:  (!) 36.2 C  SpO2: 98% 100%    Last Pain:  Vitals:   08/20/21 1109  TempSrc: Temporal  PainSc: 0-No pain                 Cleda Mccreedy Krystopher Kuenzel

## 2021-08-20 NOTE — Anesthesia Preprocedure Evaluation (Signed)
Anesthesia Evaluation  Patient identified by MRN, date of birth, ID band Patient awake    Reviewed: Allergy & Precautions, NPO status , Patient's Chart, lab work & pertinent test results  History of Anesthesia Complications Negative for: history of anesthetic complications  Airway Mallampati: III  TM Distance: >3 FB Neck ROM: full    Dental  (+) Chipped   Pulmonary neg pulmonary ROS, neg shortness of breath,    Pulmonary exam normal        Cardiovascular (-) angina(-) Past MI negative cardio ROS Normal cardiovascular exam     Neuro/Psych PSYCHIATRIC DISORDERS negative neurological ROS     GI/Hepatic negative GI ROS, Neg liver ROS, neg GERD  ,  Endo/Other  negative endocrine ROS  Renal/GU      Musculoskeletal   Abdominal   Peds  Hematology negative hematology ROS (+)   Anesthesia Other Findings Past Medical History: 2016: History of kidney stones 2016: Left ankle sprain     Comment:  2019 07/2021: Redundant prepuce  Past Surgical History: No date: NO PAST SURGERIES  BMI    Body Mass Index: 27.61 kg/m      Reproductive/Obstetrics negative OB ROS                             Anesthesia Physical Anesthesia Plan  ASA: 2  Anesthesia Plan: General LMA   Post-op Pain Management:    Induction: Intravenous  PONV Risk Score and Plan: Dexamethasone, Ondansetron, Midazolam and Treatment may vary due to age or medical condition  Airway Management Planned: LMA  Additional Equipment:   Intra-op Plan:   Post-operative Plan: Extubation in OR  Informed Consent: I have reviewed the patients History and Physical, chart, labs and discussed the procedure including the risks, benefits and alternatives for the proposed anesthesia with the patient or authorized representative who has indicated his/her understanding and acceptance.     Dental Advisory Given  Plan Discussed with:  Anesthesiologist, CRNA and Surgeon  Anesthesia Plan Comments: (Patient consented for risks of anesthesia including but not limited to:  - adverse reactions to medications - damage to eyes, teeth, lips or other oral mucosa - nerve damage due to positioning  - sore throat or hoarseness - Damage to heart, brain, nerves, lungs, other parts of body or loss of life  Patient voiced understanding.)        Anesthesia Quick Evaluation

## 2021-08-20 NOTE — H&P (Signed)
   Urology H&P   History of Present Illness: Earl Allen is a 26 y.o. male initially seen July 2022 with request for circumcision.  No history of balanitis or phimosis.  He was initially scheduled August 2022 however they elected to hold off on the procedure until now.   Past Medical History:  Diagnosis Date   History of kidney stones 2016   Left ankle sprain 2016   2019   Redundant prepuce 07/2021    Past Surgical History:  Procedure Laterality Date   NO PAST SURGERIES      Home Medications:  No outpatient medications have been marked as taking for the 08/20/21 encounter [facility] Encounter).    Allergies: No Known Allergies  No family history on file.  Social History:  reports that he has never smoked. His smokeless tobacco use includes chew. He reports current alcohol use. He reports that he does not currently use drugs.  ROS: Noncontributory except as per the HPI  Physical Exam:  Vital signs in last 24 hours:   Constitutional:  Alert and oriented, No acute distress HEENT: [place] AT, moist mucus membranes.  Trachea midline, no masses Cardiovascular: Regular rate and rhythm, no clubbing, cyanosis, or edema. Respiratory: Normal respiratory effort, lungs clear bilaterally GI: Abdomen is soft, nontender, nondistended, no abdominal masses GU: Phallus without lesions.  Foreskin easily retracts. Skin: No rashes, bruises or suspicious lesions Lymph: No cervical or inguinal adenopathy Neurologic: Grossly intact, no focal deficits, moving all 4 extremities Psychiatric: Normal mood and affect   Laboratory Data:  No results for input(s): WBC, HGB, HCT in the last 72 hours. No results for input(s): NA, K, CL, CO2, GLUCOSE, BUN, CREATININE, CALCIUM in the last 72 hours. No results for input(s): LABPT, INR in the last 72 hours. No results for input(s): LABURIN in the last 72 hours. Results for orders placed or performed in visit on 02/23/19  GC/Chlamydia Probe Amp     Status:  None   Collection Time: 02/23/19  3:38 PM   Specimen: Urine   UR     CD- [id number] MP  Result Value Ref Range Status   Chlamydia trachomatis, NAA Negative Negative Final   Neisseria Gonorrhoeae by PCR Negative Negative Final  Trichomonas vaginalis, RNA     Status: None   Collection Time: 02/23/19  3:38 PM   UR     CD- [id number] MP  Result Value Ref Range Status   Trich vag by NAA Negative Negative Final     Impression/Assessment:   1.  Redundant prepuce  Plan:  He has requested circumcision. The procedure was again reviewed including potential risks of bleeding, infection, scar tissue formation The postoperative care and follow-up was discussed.  He was informed it will take at least 1 month for complete healing and will need to abstain from intercourse during that time.  All questions were answered and he desires to proceed   08/20/2021, 7:18 AM  Irineo Axon,  MD

## 2021-08-20 NOTE — Interval H&P Note (Signed)
History and Physical Interval Note:  08/20/2021 8:21 AM  Earl Allen  has presented today for surgery, with the diagnosis of Redundant Prepuce.  The various methods of treatment have been discussed with the patient and family. After consideration of risks, benefits and other options for treatment, the patient has consented to  Procedure(s): CIRCUMCISION ADULT (N/A) as a surgical intervention.  The patient's history has been reviewed, patient examined, no change in status, stable for surgery.  I have reviewed the patient's chart and labs.  Questions were answered to the patient's satisfaction.     Jasmon Mattice C Harmony Sandell

## 2021-08-20 NOTE — Op Note (Signed)
Date of procedure: 08/20/21  Preoperative diagnosis:  Redundant prepuce  Postoperative diagnosis:  Redundant prepuce  Procedure: Circumcision  Surgeon: Irineo Axon, MD  Anesthesia: General  Complications: None  Intraoperative findings:  Normal exam  EBL: Minimal  Specimens: None  Indication: Earl Allen is a 26 y.o. male with redundant prepuce requesting circumcision.  After reviewing the management options for treatment, he elected to proceed with the above surgical procedure(s). We have discussed the potential benefits and risks of the procedure, side effects of the proposed treatment, the likelihood of the patient achieving the goals of the procedure, and any potential problems that might occur during the procedure or recuperation. Informed consent has been obtained.  Description of procedure:  The patient was taken to the operating room and general anesthesia was induced.  The patient was placed in the supine position, prepped and draped in the usual sterile fashion, and preoperative antibiotics were administered. A preoperative time-out was performed.   The outline of the corona radiata was marked circumferentially along the outer prepuce.  A circumferential incision was made following this line.  The prepuce was retracted and a second circumferential incision was made approximately 5 mm proximal to the corona radiata.  The intervening sleeve of preputial tissue was sharply excised.  Hemostasis was obtained with cautery.  The frenulum was reapproximated with a 4-0 running chromic suture.  The proximal and distal skin edges were then reapproximated with interrupted 3-0 chromic suture in quadrants.  A dorsal penile and ring block was then performed with 7 cc of 0.25% plain Sensorcaine  A dressing of Vaseline gauze, Kling and Stretch Net was then placed  After anesthetic reversal the patient was transported to PACU in stable condition.  Plan: To remove dressing in 48  hours Postop follow-up scheduled 09/23/2021   Irineo Axon, M.D.

## 2021-08-20 NOTE — Discharge Instructions (Addendum)
AMBULATORY SURGERY  DISCHARGE INSTRUCTIONS   The drugs that you were given will stay in your system until tomorrow so for the next 24 hours you should not:  Drive an automobile Make any legal decisions Drink any alcoholic beverage   You may resume regular meals tomorrow.  Today it is better to start with liquids and gradually work up to solid foods.  You may eat anything you prefer, but it is better to start with liquids, then soup and crackers, and gradually work up to solid foods.   Please notify your doctor immediately if you have any unusual bleeding, trouble breathing, redness and pain at the surgery site, drainage, fever, or pain not relieved by medication.    Additional Instructions:   Postoperative instructions for circumcision  Wound:  In most cases your incision will have absorbable sutures that run along the course of your incision and will dissolve within the first 10-20 days. Some will fall out even earlier. Expect some redness as the sutures dissolved but this should occur only around the sutures. If there is generalized redness, especially with increasing pain or swelling, let us know. The penis will very likely get "black and blue" as the blood in the tissues spread. Sometimes the whole penis will turn colors. The black and blue is followed by a yellow and brown color. In time, all the discoloration will go away.  Diet:  You may return to your normal diet within 24 hours following your surgery. You may note some mild nausea and possibly vomiting the first 6-8 hours following surgery. This is usually due to the side effects of anesthesia, and will disappear quite soon. I would suggest clear liquids and a very light meal the first evening following your surgery.  Activity:  Your physical activity should be restricted the first 48 hours. During that time you should remain relatively inactive, moving about only when necessary. During the first 7-10 days following  surgery he should avoid lifting any heavy objects (anything greater than 15 pounds), and avoid strenuous exercise. If you work, ask [place] specifically about your restrictions, both for work and home. We will write a note to your employer if needed.  No intercourse or masturbation for 4 weeks  Ice packs can be placed on and off over the penis for the first 48 hours to help relieve the pain and keep the swelling down. Frozen peas or corn in a ZipLock bag can be frozen, used and re-frozen. Fifteen minutes on and 15 minutes off is a reasonable schedule.   Dressing:  Your dressing consist of 3 layers: An inner layer of Vaseline gauze around the incision, a gauze dressing around the penis and a mesh dressing to hold in place.  You may remove this dressing in 48 hours.  Remove earlier if it gets saturated with urine or falls over the head of the penis.  If the Vaseline gauze sticks to the incision you may remove in the shower.  Hygiene:  You may shower 48 hours after your surgery. Tub bathing should be restricted until the seventh day.  Medication:  You will be sent home with some type of pain medication. In many cases you will be sent home with a narcotic pain pill (Vicodin or Tylox). If the pain is not too bad, you may take either Tylenol (acetaminophen) or Advil (ibuprofen) which contain no narcotic agents, and might be tolerated a little better, with fewer side effects. If the pain medication you are sent home with does  not control the pain, you will have to let us know. Some narcotic pain medications cannot be given or refilled by a phone call to a pharmacy.  Problems you should report to [place]:  Fever of 101.0 degrees Fahrenheit or greater. Moderate or severe swelling under the skin incision or involving the scrotum. Drug reaction such as hives, a rash, nausea or vomiting.   Postop follow-up scheduled 09/23/2021     Please contact your physician with any problems or Same Day Surgery at  [phone], Monday through Friday 6 am to 4 pm, or [place] [facility] number at [phone].

## 2021-08-21 ENCOUNTER — Encounter: Payer: Self-pay | Admitting: Urology

## 2021-09-23 ENCOUNTER — Encounter: Payer: Self-pay | Admitting: Physician Assistant

## 2021-09-23 ENCOUNTER — Ambulatory Visit (INDEPENDENT_AMBULATORY_CARE_PROVIDER_SITE_OTHER): Payer: PRIVATE HEALTH INSURANCE | Admitting: Physician Assistant

## 2021-09-23 VITALS — BP 138/74 | HR 62 | Ht 71.0 in | Wt 196.1 lb

## 2021-09-23 DIAGNOSIS — N478 Other disorders of prepuce: Secondary | ICD-10-CM | POA: Diagnosis not present

## 2021-09-23 NOTE — Progress Notes (Signed)
Patient presented to clinic today for wound recheck s/p circumcision with Dr. Lonna Cobb on 08/20/2021.  He reports he is healing well with no acute concerns.  He wonders if he can resume sexual activity.  On physical exam, circumcision is well-healed with no residual suture material.  Counseled patient that he may resume sexual activity.  Counseled him to use lubricant initially to reduce friction on his surgical site.  We discussed anticipating increased sensitivity following circumcision which should return to normal in the coming weeks.  He expressed understanding.  Carman Ching, PA-C 09/23/21 2:43 PM  Return if symptoms worsen or fail to improve.

## 2021-11-20 ENCOUNTER — Encounter: Payer: Self-pay | Admitting: Internal Medicine

## 2021-11-22 ENCOUNTER — Ambulatory Visit: Payer: PRIVATE HEALTH INSURANCE | Admitting: Medical

## 2021-11-22 VITALS — BP 110/70 | HR 58 | Temp 97.8°F | Wt 195.2 lb

## 2021-11-22 DIAGNOSIS — J029 Acute pharyngitis, unspecified: Secondary | ICD-10-CM | POA: Diagnosis not present

## 2021-11-22 NOTE — Patient Instructions (Signed)
Your diagnosis today includes: viral sore throat, not strep, likely not mono   Medications prescribed today:  Specific home care recommendations today include: Only take over-the-counter (OTC) or prescription medicines for pain, discomfort, or fever as directed by your caregiver.   Sore throat remedies:  You may use salt water gargles, warm fluids such as coffee or hot tea, or honey/tea/lemon mixture to sooth sore throat pain.  You may use OTC sore throat remedies such as Cepacol lozenges or Chloraseptic spray for sore throat pain. Pain/fever relief: You may use over-the-counter Tylenol for pain or fever.  Or you can use over the counter Ibuprofen 200mg , 3 tablets twice daily for a few days Drink extra fluids. Fluids help thin the mucus so your sinuses can drain more easily.   Please call or return if worse or not improving in the next few days.      Pharyngitis Pharyngitis is a sore throat (pharynx). There is redness, pain, and swelling of your throat. HOME CARE  Drink enough fluids to keep your pee (urine) clear or pale yellow. Only take medicine as told by your doctor. You may get sick again if you do not take medicine as told. Finish your medicines, even if you start to feel better. Do not take aspirin. Rest. Rinse your mouth (gargle) with salt water ( tsp of salt per 1 qt of water) every 1-2 hours. This will help the pain. If you are not at risk for choking, you can suck on hard candy or sore throat lozenges. GET HELP IF: You have large, tender lumps on your neck. You have a rash. You cough up green, yellow-brown, or bloody spit. GET HELP RIGHT AWAY IF:  You have a stiff neck. You drool or cannot swallow liquids. You throw up (vomit) or are not able to keep medicine or liquids down. You have very bad pain that does not go away with medicine. You have problems breathing (not from a stuffy nose). MAKE SURE YOU:  Understand these instructions. Will watch your condition. Will  get help right away if you are not doing well or get worse. Document Released: 08/20/2007 Document Revised: 12/22/2012 Document Reviewed: 11/08/2012 [facility] Patient Information 2015 [place], [place]. This information is not intended to replace advice given to you by your health care provider. Make sure you discuss any questions you have with your health care provider.

## 2021-11-22 NOTE — Progress Notes (Signed)
Subjective:  Earl Allen is a 26 y.o. male who presents for Chief Complaint  Patient presents with   Sore Throat    Sore throat, white spots     Here for sore throat.   Started getting sore throat 5 days ago.  Prior to that had been smoking a few cigars.  In general he does chew tobacco.  All this week has had some swollen tonsils, some sore throat, but not too painful.   No other URI symptoms.  No sneezing, no cough, no ear pain, no fever, no body aches or chills.   No contacts with strep, tonsillitis, mono.  Sees dentist every 32mo.  no other aggravating or relieving factors.    No other c/o.  The following portions of the patient's history were reviewed and updated as appropriate: allergies, current medications, past family history, past medical history, past social history, past surgical history and problem list.  ROS Otherwise as in subjective above    Objective: BP 110/70   Pulse (!) 58   Temp 97.8 F (36.6 C)   Wt 195 lb 3.2 oz (88.5 kg)   BMI 27.22 kg/m   General appearance: alert, no distress, well developed, well nourished HEENT: normocephalic, sclerae anicteric, conjunctiva pink and moist, TMs pearly, nares patent, no discharge or erythema, pharynx with 2+ tonsils, mild erythema, no exudate Oral cavity: MMM, no lesions Neck: supple, no lymphadenopathy, no thyromegaly, no masses    Assessment: Encounter Diagnoses  Name Primary?   Viral pharyngitis Yes   Sore throat      Plan: Strep negative.  Exam and symptoms suggest viral pharyngitis  Patient Instructions  Your diagnosis today includes: viral sore throat, not strep, likely not mono   Medications prescribed today:  Specific home care recommendations today include: Only take over-the-counter (OTC) or prescription medicines for pain, discomfort, or fever as directed by your caregiver.   Sore throat remedies:  You may use salt water gargles, warm fluids such as coffee or hot tea, or honey/tea/lemon  mixture to sooth sore throat pain.  You may use OTC sore throat remedies such as Cepacol lozenges or Chloraseptic spray for sore throat pain. Pain/fever relief: You may use over-the-counter Tylenol for pain or fever.  Or you can use over the counter Ibuprofen 200mg , 3 tablets twice daily for a few days Drink extra fluids. Fluids help thin the mucus so your sinuses can drain more easily.   Please call or return if worse or not improving in the next few days.      Pharyngitis Pharyngitis is a sore throat (pharynx). There is redness, pain, and swelling of your throat. HOME CARE  Drink enough fluids to keep your pee (urine) clear or pale yellow. Only take medicine as told by your doctor. You may get sick again if you do not take medicine as told. Finish your medicines, even if you start to feel better. Do not take aspirin. Rest. Rinse your mouth (gargle) with salt water ( tsp of salt per 1 qt of water) every 1-2 hours. This will help the pain. If you are not at risk for choking, you can suck on hard candy or sore throat lozenges. GET HELP IF: You have large, tender lumps on your neck. You have a rash. You cough up green, yellow-brown, or bloody spit. GET HELP RIGHT AWAY IF:  You have a stiff neck. You drool or cannot swallow liquids. You throw up (vomit) or are not able to keep medicine or liquids down. You  have very bad pain that does not go away with medicine. You have problems breathing (not from a stuffy nose). MAKE SURE YOU:  Understand these instructions. Will watch your condition. Will get help right away if you are not doing well or get worse. Document Released: 08/20/2007 Document Revised: 12/22/2012 Document Reviewed: 11/08/2012 [facility] Patient Information 2015 [place], [place]. This information is not intended to replace advice given to you by your health care provider. Make sure you discuss any questions you have with your health care provider.    Earl Allen was seen today  for sore throat.  Diagnoses and all orders for this visit:  Viral pharyngitis  Sore throat    Follow up: prn

## 2021-12-06 ENCOUNTER — Emergency Department (HOSPITAL_COMMUNITY)
Admission: EM | Admit: 2021-12-06 | Discharge: 2021-12-06 | Disposition: A | Discharge status: Home / Self Care | Payer: Worker's Compensation | Attending: Emergency Medicine | Admitting: Emergency Medicine

## 2021-12-06 ENCOUNTER — Other Ambulatory Visit: Payer: Self-pay

## 2021-12-06 ENCOUNTER — Encounter (HOSPITAL_COMMUNITY): Payer: Self-pay

## 2021-12-06 DIAGNOSIS — Y990 Civilian activity done for income or pay: Secondary | ICD-10-CM | POA: Insufficient documentation

## 2021-12-06 DIAGNOSIS — Z23 Encounter for immunization: Secondary | ICD-10-CM | POA: Insufficient documentation

## 2021-12-06 DIAGNOSIS — S6991XA Unspecified injury of right wrist, hand and finger(s), initial encounter: Secondary | ICD-10-CM | POA: Diagnosis present

## 2021-12-06 DIAGNOSIS — W268XXA Contact with other sharp object(s), not elsewhere classified, initial encounter: Secondary | ICD-10-CM | POA: Diagnosis not present

## 2021-12-06 DIAGNOSIS — S6990XA Unspecified injury of unspecified wrist, hand and finger(s), initial encounter: Secondary | ICD-10-CM

## 2021-12-06 MED ORDER — TETANUS-DIPHTH-ACELL PERTUSSIS 5-2.5-18.5 LF-MCG/0.5 IM SUSY
0.5000 mL | PREFILLED_SYRINGE | Freq: Once | INTRAMUSCULAR | Status: AC
  Administered 2021-12-06: 0.5 mL via INTRAMUSCULAR
  Filled 2021-12-06: qty 0.5

## 2021-12-06 NOTE — ED Triage Notes (Signed)
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 Patient was chasing a person and jumped over a barbed wire fence injuring bilat hands, unknown tetanus date

## 2021-12-06 NOTE — Discharge Instructions (Signed)
You are seen in the emergency department today for hand injury.  We have updated your tetanus.  Make sure you are keeping the areas clean and bandaged appropriately.

## 2021-12-06 NOTE — ED Provider Notes (Signed)
  [facility] EMERGENCY DEPARTMENT Provider Note   CSN: [id number] Arrival date & time: 12/06/21  1600     History  Chief Complaint  Patient presents with   Hand Injury    Earl Allen is a 26 y.o. male with no significant past medical history who presents the emergency department complaining of hand injuries sustained at work today.  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  States that it punctured both of his hands.  Unknown last tetanus.   Hand Injury      Home Medications Prior to Admission medications   Not on File      Allergies    Patient has no known allergies.    Review of Systems   Review of Systems  Skin:  Positive for wound.  All other systems reviewed and are negative.   Physical Exam Updated Vital Signs BP (!) 145/89 (BP Location: Right Arm)   Pulse (!) 114   Temp 99.5 F (37.5 C) (Oral)   Resp 16   Ht 5\' 11"  (1.803 m)   Wt 88.5 kg   SpO2 97%   BMI 27.20 kg/m  Physical Exam Vitals and nursing note reviewed.  Constitutional:      Appearance: Normal appearance.  HENT:     Head: Normocephalic and atraumatic.  Eyes:     Conjunctiva/sclera: Conjunctivae normal.  Pulmonary:     Effort: Pulmonary effort is normal. No respiratory distress.  Musculoskeletal:     Comments: Multiple small puncture wounds over bilateral hands, both palmar and dorsal aspects.   Skin:    General: Skin is warm and dry.  Neurological:     Mental Status: He is alert.  Psychiatric:        Mood and Affect: Mood normal.        Behavior: Behavior normal.     ED Results / Procedures / Treatments   Labs (all labs ordered are listed, but only abnormal results are displayed) Labs Reviewed - No data to display  EKG None  Radiology No results found.  Procedures Procedures    Medications Ordered in ED Medications  Tdap (BOOSTRIX) injection 0.5 mL (0.5 mLs Intramuscular Given 12/06/21 1729)    ED Course/  Medical Decision Making/ A&P                           Medical Decision Making Risk Prescription drug management.   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  On exam patient has puncture wounds to bilateral hands, both palmar and dorsal aspects.  None large enough that would require suture repair.  Wounds were cleaned prior to ER arrival.  Unknown last tetanus.  Tdap updated.  Patient not requiring admission.  Discharged in stable condition and work note provided.        Final Clinical Impression(s) / ED Diagnoses Final diagnoses:  Injury of hand, unspecified laterality, initial encounter    Rx / DC Orders ED Discharge Orders     None      Portions of this report may have been transcribed using voice recognition software. Every effort was made to ensure accuracy; however, inadvertent computerized transcription errors may be present.    Estill Cotta 12/06/21 1859    Milton Ferguson, MD 12/07/21 1339

## 2021-12-24 ENCOUNTER — Encounter: Payer: Self-pay | Admitting: Internal Medicine

## 2022-01-20 ENCOUNTER — Ambulatory Visit: Payer: PRIVATE HEALTH INSURANCE | Admitting: Medical

## 2022-01-20 ENCOUNTER — Encounter: Payer: Self-pay | Admitting: Medical

## 2022-01-20 VITALS — BP 120/80 | HR 77 | Ht 71.0 in | Wt 196.2 lb

## 2022-01-20 DIAGNOSIS — Z0000 Encounter for general adult medical examination without abnormal findings: Secondary | ICD-10-CM

## 2022-01-20 DIAGNOSIS — Z87442 Personal history of urinary calculi: Secondary | ICD-10-CM | POA: Diagnosis not present

## 2022-01-20 DIAGNOSIS — Z13220 Encounter for screening for lipoid disorders: Secondary | ICD-10-CM

## 2022-01-20 DIAGNOSIS — Z23 Encounter for immunization: Secondary | ICD-10-CM

## 2022-01-20 DIAGNOSIS — Z139 Encounter for screening, unspecified: Secondary | ICD-10-CM

## 2022-01-20 DIAGNOSIS — Z113 Encounter for screening for infections with a predominantly sexual mode of transmission: Secondary | ICD-10-CM

## 2022-01-20 NOTE — Addendum Note (Signed)
Addended by: Minette Headland A on: 01/20/2022 03:58 PM   Modules accepted: Orders

## 2022-01-20 NOTE — Progress Notes (Signed)
Subjective:   HPI  Earl Allen is a 26 y.o. male who presents for Chief Complaint  Patient presents with   nonfasting cpe    Nonfasting cpe, no concerns. Flu and covid    Patient Care Team: Xinyi Batton, Cleda Mccreedy as PCP - General (Family Medicine) Sees dentist  Concerns: None, doing okay  Reviewed their medical, surgical, family, social, medication, and allergy history and updated chart as appropriate.  Past Medical History:  Diagnosis Date   History of kidney stones 2016   Left ankle sprain 2016   2019   Redundant prepuce 07/2021    Past Surgical History:  Procedure Laterality Date   CIRCUMCISION N/A 08/20/2021   Procedure: CIRCUMCISION ADULT;  Surgeon: Riki Altes, MD;  Location: [facility];  Service: Urology;  Laterality: N/A;    Family History  Problem Relation Age of Onset   Cancer Neg Hx    Heart disease Neg Hx    Diabetes Neg Hx    Stroke Neg Hx     No current outpatient medications on file.  No Known Allergies   Review of Systems Constitutional: -fever, -chills, -sweats, -unexpected weight change, -decreased appetite, -fatigue Allergy: -sneezing, -itching, -congestion Dermatology: -changing moles, --rash, -lumps ENT: -runny nose, -ear pain, -sore throat, -hoarseness, -sinus pain, -teeth pain, - ringing in ears, -hearing loss, -nosebleeds Cardiology: -chest pain, -palpitations, -swelling, -difficulty breathing when lying flat, -waking up short of breath Respiratory: -cough, -shortness of breath, -difficulty breathing with exercise or exertion, -wheezing, -coughing up blood Gastroenterology: -abdominal pain, -nausea, -vomiting, -diarrhea, -constipation, -blood in stool, -changes in bowel movement, -difficulty swallowing or eating Hematology: -bleeding, -bruising  Musculoskeletal: -joint aches, -muscle aches, -joint swelling, -back pain, -neck pain, -cramping, -changes in gait Ophthalmology: denies vision changes, eye redness, itching,  discharge Urology: -burning with urination, -difficulty urinating, -blood in urine, -urinary frequency, -urgency, -incontinence Neurology: -headache, -weakness, -tingling, -numbness, -memory loss, -falls, -dizziness Psychology: -depressed mood, -agitation, -sleep problems Male GU: no testicular mass, pain, no lymph nodes swollen, no swelling, no rash.     11/22/2021    1:30 PM 03/23/2019   11:59 AM 02/23/2019    2:37 PM  Depression screen PHQ 2/9  Decreased Interest 0 2 2  Down, Depressed, Hopeless 0 0 2  PHQ - 2 Score 0 2 4  Altered sleeping  2 3  Tired, decreased energy  3 3  Change in appetite  0 3  Feeling bad or failure about yourself   1 3  Trouble concentrating  3 3  Moving slowly or fidgety/restless  0 2  Suicidal thoughts  0 0  PHQ-9 Score  11 21  Difficult doing work/chores  Not difficult at all Somewhat difficult        Objective:  BP 120/80   Pulse 77   Ht 5\' 11"  (1.803 m)   Wt 196 lb 3.2 oz (89 kg)   BMI 27.36 kg/m   Wt Readings from Last 3 Encounters:  01/20/22 196 lb 3.2 oz (89 kg)  11/22/21 195 lb 3.2 oz (88.5 kg)  09/23/21 196 lb 1.6 oz (89 kg)   General appearance: alert, no distress, WD/WN, African American male Skin: unremarkable, tattoos left upper arm, right volar wrist, no worrisome lesions HEENT: normocephalic, conjunctiva/corneas normal, sclerae anicteric, PERRLA, EOMi, nares patent, no discharge or erythema, pharynx normal Oral cavity: MMM, tongue normal, teeth normal Neck: supple, no lymphadenopathy, no thyromegaly, no masses, normal ROM, no bruits Chest: non tender, normal shape and expansion Heart: RRR, normal S1,  S2, no murmurs Lungs: CTA bilaterally, no wheezes, rhonchi, or rales Abdomen: +bs, soft, non tender, non distended, no masses, no hepatomegaly, no splenomegaly, no bruits Back: non tender, normal ROM, no scoliosis Musculoskeletal: upper extremities non tender, no obvious deformity, normal ROM throughout, lower extremities non  tender, no obvious deformity, normal ROM throughout Extremities: no edema, no cyanosis, no clubbing Pulses: 2+ symmetric, upper and lower extremities, normal cap refill Neurological: alert, oriented x 3, CN2-12 intact, strength normal upper extremities and lower extremities, sensation normal throughout, DTRs 2+ throughout, no cerebellar signs, gait normal Psychiatric: normal affect, behavior normal, pleasant  GU: normal male external genitalia,circumcised, nontender, no masses, no hernia, no lymphadenopathy Rectal: deferred   Assessment and Plan :   Encounter Diagnoses  Name Primary?   Encounter for health maintenance examination in adult Yes   Need for influenza vaccination    History of kidney stones    Screening for lipid disorders    Screen for STD (sexually transmitted disease)    Screening for condition     This visit was a preventative care visit, also known as wellness visit or routine physical.   Topics typically include healthy lifestyle, diet, exercise, preventative care, vaccinations, sick and well care, proper use of emergency dept and after hours care, as well as other concerns.     Recommendations: Continue to return yearly for your annual wellness and preventative care visits.  This gives [place] a chance to discuss healthy lifestyle, exercise, vaccinations, review your chart record, and perform screenings where appropriate.  I recommend you see your dentist yearly for routine dental care including hygiene visits twice yearly.   Vaccination recommendations were reviewed Immunization History  Administered Date(s) Administered   Tdap 10/31/2014, 12/06/2021    Counseled on the influenza virus vaccine.  Vaccine information sheet given.  Influenza vaccine given after consent obtained.  Declines HPV vaccine   Screening for cancer: Colon cancer screening: Age 2  Testicular cancer screening You should do a monthly self testicular exam if you are between 20-40 years  old  We discussed PSA, prostate exam, and prostate cancer screening risks/benefits.   Age 24  Skin cancer screening: Check your skin regularly for new changes, growing lesions, or other lesions of concern Come in for evaluation if you have skin lesions of concern.  Lung cancer screening: If you have a greater than 20 pack year history of tobacco use, then you may qualify for lung cancer screening with a chest CT scan.   Please call your insurance company to inquire about coverage for this test.  We currently don't have screenings for other cancers besides breast, cervical, colon, and lung cancers.  If you have a strong family history of cancer or have other cancer screening concerns, please let me know.    Bone health: Get at least 150 minutes of aerobic exercise weekly Get weight bearing exercise at least once weekly Bone density test:  A bone density test is an imaging test that uses a type of X-ray to measure the amount of calcium and other minerals in your bones. The test may be used to diagnose or screen you for a condition that causes weak or thin bones (osteoporosis), predict your risk for a broken bone (fracture), or determine how well your osteoporosis treatment is working. The bone density test is recommended for females 65 and older, or females or males <65 if certain risk factors such as thyroid disease, long term use of steroids such as for asthma or rheumatological  issues, vitamin D deficiency, estrogen deficiency, family history of osteoporosis, self or family history of fragility fracture in first degree relative.    Heart health: Get at least 150 minutes of aerobic exercise weekly Limit alcohol It is important to maintain a healthy blood pressure and healthy cholesterol numbers  Heart disease screening: Screening for heart disease includes screening for blood pressure, fasting lipids, glucose/diabetes screening, BMI height to weight ratio, reviewed of smoking status,  physical activity, and diet.    Goals include blood pressure 120/80 or less, maintaining a healthy lipid/cholesterol profile, preventing diabetes or keeping diabetes numbers under good control, not smoking or using tobacco products, exercising most days per week or at least 150 minutes per week of exercise, and eating healthy variety of fruits and vegetables, healthy oils, and avoiding unhealthy food choices like fried food, fast food, high sugar and high cholesterol foods.    Other tests may possibly include EKG test, CT coronary calcium score, echocardiogram, exercise treadmill stress test.     Medical care options: I recommend you continue to seek care here first for routine care.  We try really hard to have available appointments Monday through Friday daytime hours for sick visits, acute visits, and physicals.  Urgent care should be used for after hours and weekends for significant issues that cannot wait till the next day.  The emergency department should be used for significant potentially life-threatening emergencies.  The emergency department is expensive, can often have long wait times for less significant concerns, so try to utilize primary care, urgent care, or telemedicine when possible to avoid unnecessary trips to the emergency department.  Virtual visits and telemedicine have been introduced since the pandemic started in 2020, and can be convenient ways to receive medical care.  We offer virtual appointments as well to assist you in a variety of options to seek medical care.   Separate significant issues discussed: History of kidney stones - once episode in college.   Nino was seen today for nonfasting cpe.  Diagnoses and all orders for this visit:  Encounter for health maintenance examination in adult -     Comprehensive metabolic panel -     CBC -     Lipid panel -     RPR+HIV+GC+CT Panel -     Hepatitis C antibody -     Hepatitis B surface antigen -     Hepatitis B  surface antibody,quantitative -     Uric acid  Need for influenza vaccination  History of kidney stones  Screening for lipid disorders -     Lipid panel  Screen for STD (sexually transmitted disease) -     RPR+HIV+GC+CT Panel -     Hepatitis C antibody -     Hepatitis B surface antigen  Screening for condition -     Hepatitis B surface antibody,quantitative -     Uric acid    Follow-up pending labs, yearly for physical

## 2022-01-22 LAB — CBC
Hematocrit: 46.6 % (ref 37.5–51.0)
Hemoglobin: 16 g/dL (ref 13.0–17.7)
MCH: 30.8 pg (ref 26.6–33.0)
MCHC: 34.3 g/dL (ref 31.5–35.7)
MCV: 90 fL (ref 79–97)
Platelets: 250 10*3/uL (ref 150–450)
RBC: 5.19 x10E6/uL (ref 4.14–5.80)
RDW: 12.1 % (ref 11.6–15.4)
WBC: 5.9 10*3/uL (ref 3.4–10.8)

## 2022-01-22 LAB — COMPREHENSIVE METABOLIC PANEL
ALT: 23 IU/L (ref 0–44)
AST: 17 IU/L (ref 0–40)
Albumin/Globulin Ratio: 1.6 (ref 1.2–2.2)
Albumin: 4.7 g/dL (ref 4.3–5.2)
Alkaline Phosphatase: 56 IU/L (ref 44–121)
BUN/Creatinine Ratio: 10 (ref 9–20)
BUN: 13 mg/dL (ref 6–20)
Bilirubin Total: 1 mg/dL (ref 0.0–1.2)
CO2: 24 mmol/L (ref 20–29)
Calcium: 10.1 mg/dL (ref 8.7–10.2)
Chloride: 102 mmol/L (ref 96–106)
Creatinine, Ser: 1.28 mg/dL — ABNORMAL HIGH (ref 0.76–1.27)
Globulin, Total: 3 g/dL (ref 1.5–4.5)
Glucose: 79 mg/dL (ref 70–99)
Potassium: 4.3 mmol/L (ref 3.5–5.2)
Sodium: 140 mmol/L (ref 134–144)
Total Protein: 7.7 g/dL (ref 6.0–8.5)
eGFR: 80 mL/min/{1.73_m2} (ref >59)

## 2022-01-22 LAB — LIPID PANEL
Chol/HDL Ratio: 3.5 ratio (ref 0.0–5.0)
Cholesterol, Total: 178 mg/dL (ref 100–199)
HDL: 51 mg/dL (ref >39)
LDL Chol Calc (NIH): 114 mg/dL — ABNORMAL HIGH (ref 0–99)
Triglycerides: 66 mg/dL (ref 0–149)
VLDL Cholesterol Cal: 13 mg/dL (ref 5–40)

## 2022-01-22 LAB — HEPATITIS B SURFACE ANTIGEN: Hepatitis B Surface Ag: NEGATIVE

## 2022-01-22 LAB — RPR+HIV+GC+CT PANEL
Chlamydia trachomatis, NAA: NEGATIVE
HIV Screen 4th Generation wRfx: NONREACTIVE
Neisseria Gonorrhoeae by PCR: NEGATIVE
RPR Ser Ql: NONREACTIVE

## 2022-01-22 LAB — HEPATITIS C ANTIBODY: Hep C Virus Ab: NONREACTIVE

## 2022-01-22 LAB — HEPATITIS B SURFACE ANTIBODY, QUANTITATIVE: Hepatitis B Surf Ab Quant: 15.3 m[IU]/mL (ref >9.9)

## 2022-01-22 LAB — URIC ACID: Uric Acid: 7.3 mg/dL (ref 3.8–8.4)

## 2022-02-22 IMAGING — MR MR ANKLE*L* W/O CM
5 series · 40 of 40 positions shown · non-contrast
Comparison: None.

CLINICAL DATA: Ankle pain. Posterior talar pain.

EXAM:
MRI OF THE LEFT ANKLE WITHOUT CONTRAST
TECHNIQUE: Multiplanar, multisequence MR imaging of the ankle was performed. No
intravenous contrast was administered.

[Series 4: T2 fat-sat · axial · 3.0mm · 0.53mm/px · z∈[-64,+57]mm · 8 of 32 slices shown (1 of 2)]
[im 1/32]
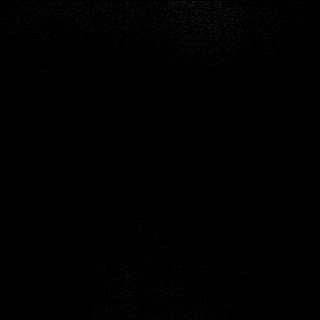
[im 5/32]
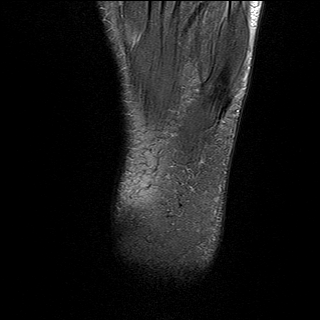
[im 9/32]
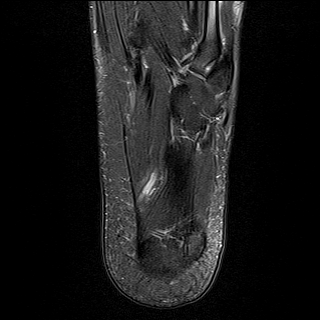
[im 14/32]
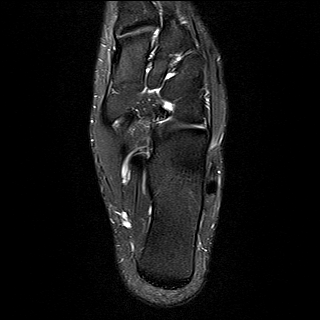
[im 18/32]
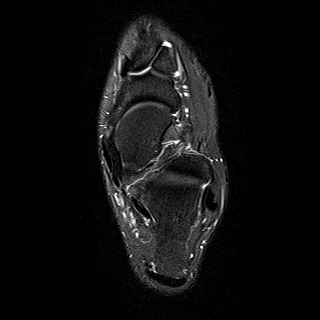
[im 23/32]
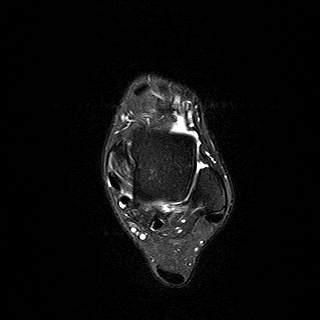
[im 27/32]
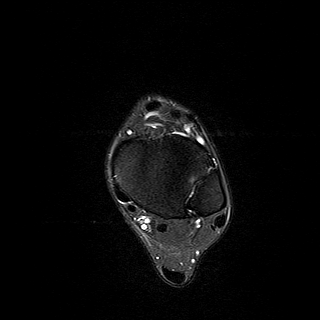
[im 32/32]
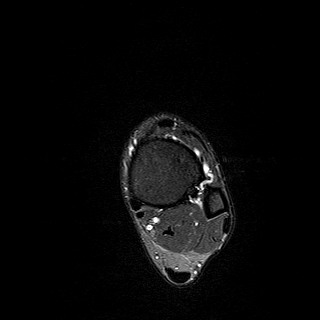

[Series 5: PD fat-sat · axial · 3.0mm · 0.53mm/px · z∈[-65,+79]mm · 10 of 38 slices shown]
[im 1/38]
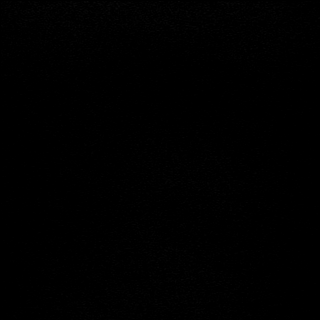
[im 5/38]
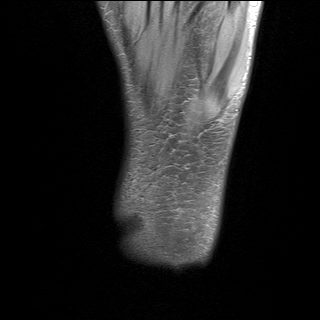
[im 9/38]
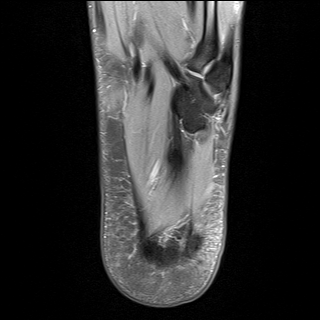
[im 13/38]
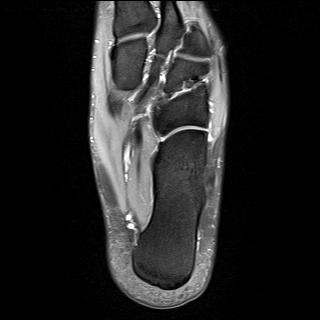
[im 17/38]
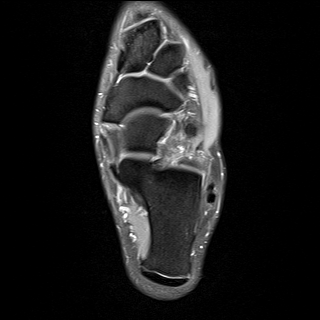
[im 21/38]
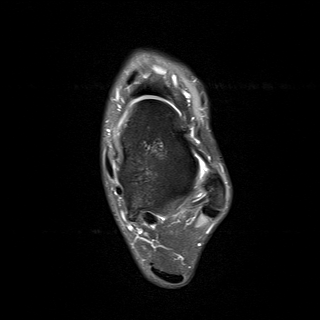
[im 25/38]
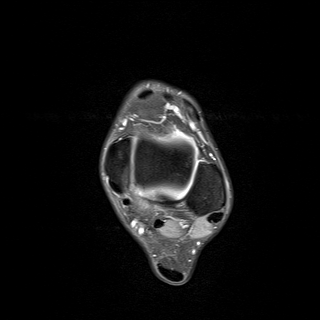
[im 29/38]
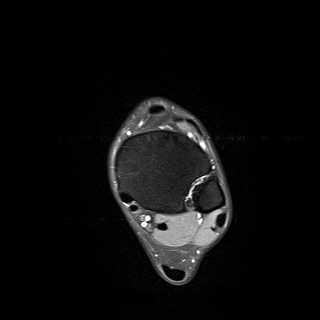
[im 33/38]
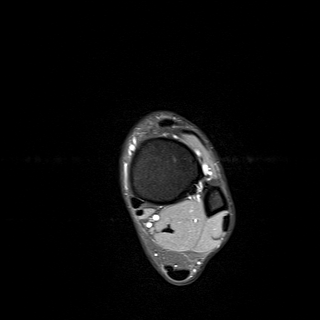
[im 38/38]
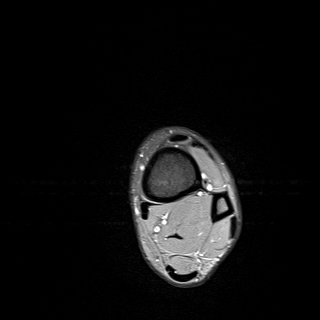

[Series 6: T1 · sagittal · 4.0mm · 0.56mm/px · 5 of 17 slices shown]
[im 1/17]
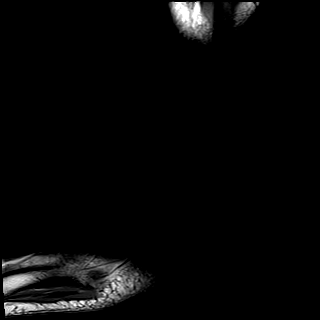
[im 5/17]
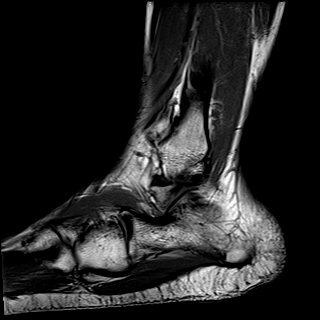
[im 9/17]
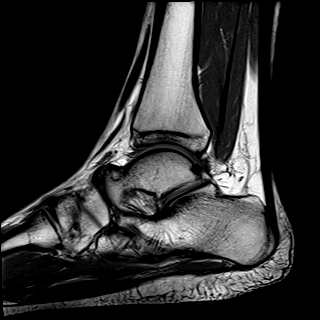
[im 13/17]
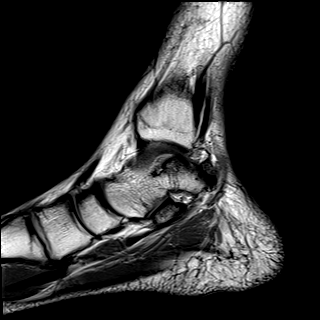
[im 17/17]
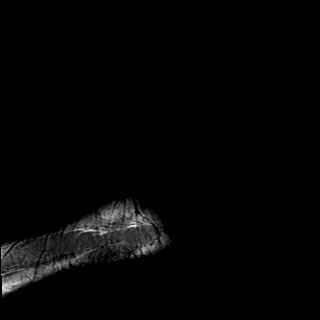

[Series 7: STIR · sagittal · 4.0mm · 0.35mm/px · 5 of 17 slices shown]
[im 1/17]
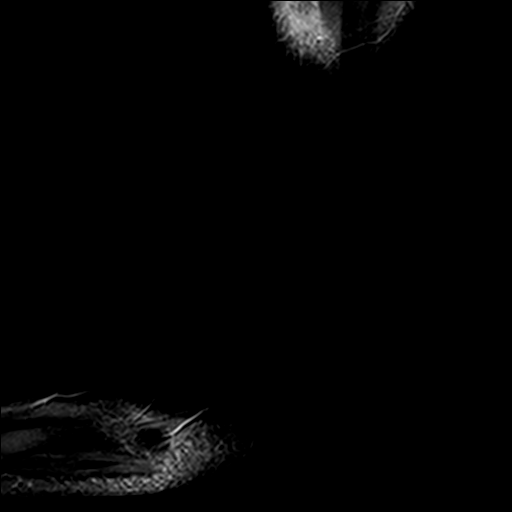
[im 5/17]
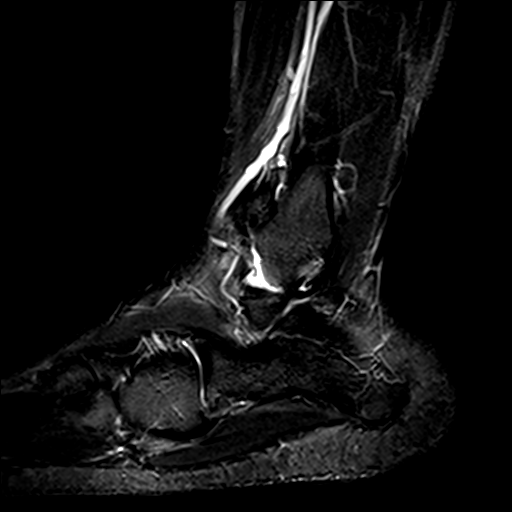
[im 9/17]
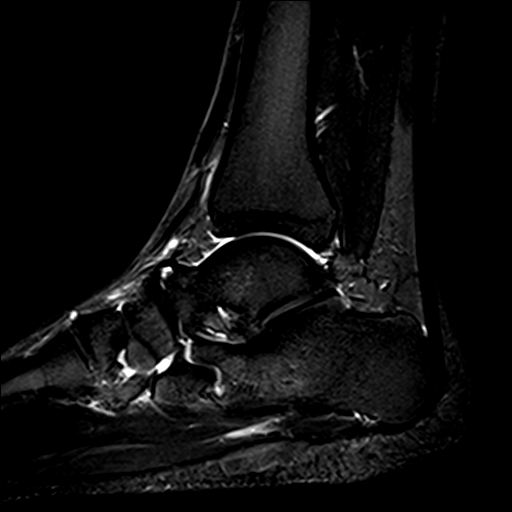
[im 13/17]
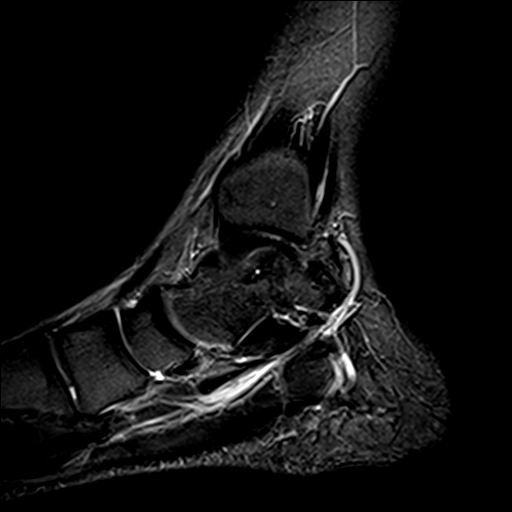
[im 17/17]
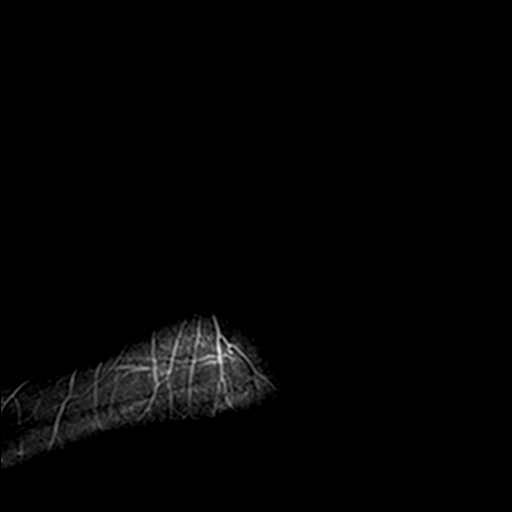

[Series 8: T2 fat-sat · coronal · 3.0mm · 0.50mm/px · 12 of 44 slices shown (2 of 2)]
[im 1/44]
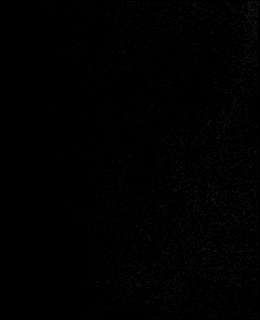
[im 4/44]
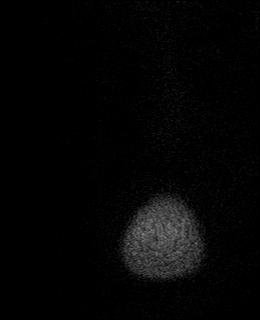
[im 8/44]
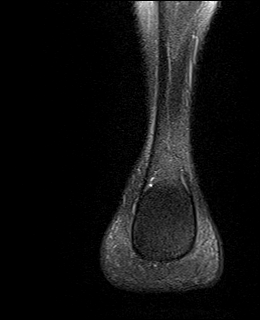
[im 12/44]
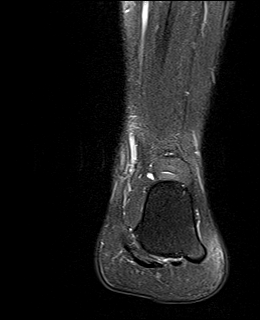
[im 16/44]
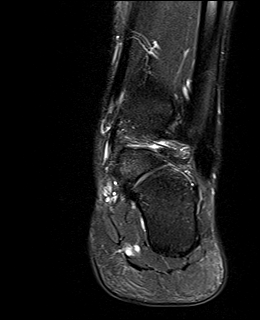
[im 20/44]
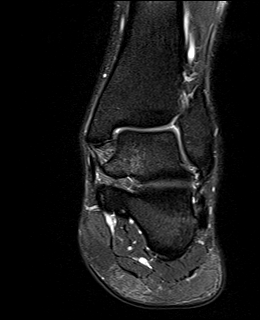
[im 24/44]
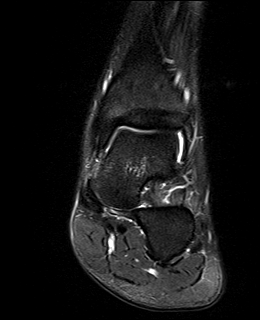
[im 28/44]
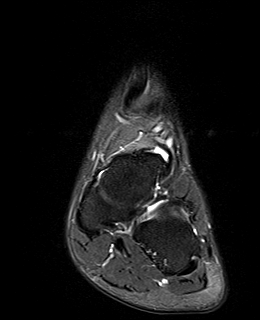
[im 32/44]
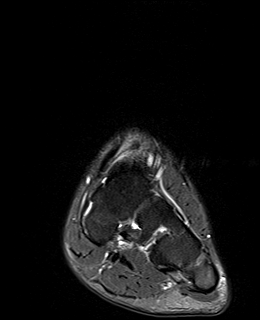
[im 36/44]
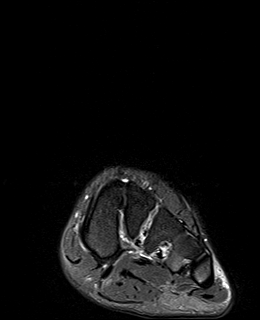
[im 40/44]
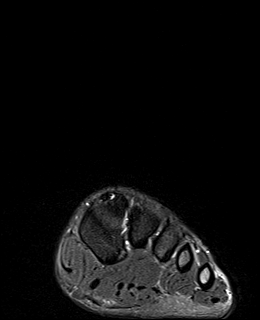
[im 44/44]
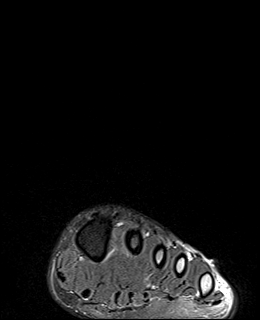

[40 of 40 positions shown; findings below may reference images not displayed]

FINDINGS: TENDONS

Peroneal: Peroneal longus tendon intact. Peroneal brevis intact.

Posteromedial: Posterior tibial tendon intact. Flexor hallucis
longus tendon intact. Flexor digitorum longus tendon intact.

Anterior: Tibialis anterior tendon intact. Extensor hallucis longus
tendon intact Extensor digitorum longus tendon intact.

Achilles:  Intact.

Plantar Fascia: Intact.

LIGAMENTS

Lateral: Anterior talofibular ligament intact. Calcaneofibular
ligament intact. Posterior talofibular ligament intact. Anterior and
posterior tibiofibular ligaments intact.

Medial: Deltoid ligament is intact.  Spring ligament intact.

CARTILAGE

Ankle Joint: No joint effusion. Normal ankle mortise. No chondral
defect.

Subtalar Joints/Sinus Tarsi: Normal subtalar joints. No subtalar
joint effusion. Normal sinus tarsi.

Bones: Mild osteoarthritis of the talonavicular joint. Os trigonum
with mild marrow edema in the os trigonum and in the adjacent
posterior talus as can be seen with os trigonum syndrome.

Soft Tissue: No fluid collection or hematoma. Muscles are normal.
IMPRESSION: 1. Os trigonum with mild marrow edema in the os trigonum and in the
adjacent posterior talus as can be seen with os trigonum syndrome.

## 2022-08-05 ENCOUNTER — Other Ambulatory Visit: Payer: Self-pay | Admitting: Nurse Practitioner

## 2022-08-05 ENCOUNTER — Ambulatory Visit: Payer: Self-pay

## 2022-08-05 DIAGNOSIS — M25511 Pain in right shoulder: Secondary | ICD-10-CM

## 2022-08-05 DIAGNOSIS — M545 Low back pain, unspecified: Secondary | ICD-10-CM

## 2023-02-04 ENCOUNTER — Encounter: Payer: Self-pay | Admitting: Medical

## 2023-02-04 ENCOUNTER — Ambulatory Visit (INDEPENDENT_AMBULATORY_CARE_PROVIDER_SITE_OTHER): Payer: 59 | Admitting: Medical

## 2023-02-04 VITALS — BP 110/70 | HR 60 | Ht 71.0 in | Wt 203.8 lb

## 2023-02-04 DIAGNOSIS — Z7185 Encounter for immunization safety counseling: Secondary | ICD-10-CM

## 2023-02-04 DIAGNOSIS — Z1389 Encounter for screening for other disorder: Secondary | ICD-10-CM | POA: Diagnosis not present

## 2023-02-04 DIAGNOSIS — Z0000 Encounter for general adult medical examination without abnormal findings: Secondary | ICD-10-CM | POA: Diagnosis not present

## 2023-02-04 DIAGNOSIS — Z13220 Encounter for screening for lipoid disorders: Secondary | ICD-10-CM

## 2023-02-04 DIAGNOSIS — Z136 Encounter for screening for cardiovascular disorders: Secondary | ICD-10-CM

## 2023-02-04 LAB — POCT URINALYSIS DIP (PROADVANTAGE DEVICE)
Bilirubin, UA: NEGATIVE
Blood, UA: NEGATIVE
Glucose, UA: NEGATIVE mg/dL
Ketones, POC UA: NEGATIVE mg/dL
Leukocytes, UA: NEGATIVE
Nitrite, UA: NEGATIVE
Protein Ur, POC: NEGATIVE mg/dL
Specific Gravity, Urine: 1.015
Urobilinogen, Ur: NEGATIVE
pH, UA: 6 (ref 5.0–8.0)

## 2023-02-04 NOTE — Progress Notes (Signed)
Subjective:   HPI  Earl Allen is a 27 y.o. male who presents for Chief Complaint  Patient presents with   Annual Exam    Fasting cpe, no concerns, declines shots    Patient Care Team: Tage Feggins, Cleda Mccreedy as PCP - General (Family Medicine) Sees dentist   Concerns: None, doing okay  Reviewed their medical, surgical, family, social, medication, and allergy history and updated chart as appropriate.  Past Medical History:  Diagnosis Date   History of kidney stones 2016   Left ankle sprain 2016   2019   Redundant prepuce 07/2021    Past Surgical History:  Procedure Laterality Date   CIRCUMCISION N/A 08/20/2021   Procedure: CIRCUMCISION ADULT;  Surgeon: Riki Altes, MD;  Location: [facility];  Service: Urology;  Laterality: N/A;    Family History  Problem Relation Age of Onset   Cancer Neg Hx    Heart disease Neg Hx    Diabetes Neg Hx    Stroke Neg Hx     No current outpatient medications on file.  No Known Allergies  Review of Systems  Constitutional:  Negative for chills, fever, malaise/fatigue and weight loss.  HENT:  Negative for congestion, ear pain, hearing loss, sore throat and tinnitus.   Eyes:  Negative for blurred vision, pain and redness.  Respiratory:  Negative for cough, hemoptysis and shortness of breath.   Cardiovascular:  Negative for chest pain, palpitations, orthopnea, claudication and leg swelling.  Gastrointestinal:  Negative for abdominal pain, blood in stool, constipation, diarrhea, nausea and vomiting.  Genitourinary:  Negative for dysuria, flank pain, frequency, hematuria and urgency.  Musculoskeletal:  Negative for falls, joint pain and myalgias.  Skin:  Negative for itching and rash.  Neurological:  Negative for dizziness, tingling, speech change, weakness and headaches.  Endo/Heme/Allergies:  Negative for polydipsia. Does not bruise/bleed easily.  Psychiatric/Behavioral:  Negative for depression and memory loss. The patient is  not nervous/anxious and does not have insomnia.         02/04/2023    9:19 AM 11/22/2021    1:30 PM 03/23/2019   11:59 AM 02/23/2019    2:37 PM  Depression screen PHQ 2/9  Decreased Interest 0 0 2 2  Down, Depressed, Hopeless 0 0 0 2  PHQ - 2 Score 0 0 2 4  Altered sleeping   2 3  Tired, decreased energy   3 3  Change in appetite   0 3  Feeling bad or failure about yourself    1 3  Trouble concentrating   3 3  Moving slowly or fidgety/restless   0 2  Suicidal thoughts   0 0  PHQ-9 Score   11 21  Difficult doing work/chores   Not difficult at all Somewhat difficult       Objective:  BP 110/70   Pulse 60   Ht 5\' 11"  (1.803 m)   Wt 203 lb 12.8 oz (92.4 kg)   BMI 28.42 kg/m   Wt Readings from Last 3 Encounters:  02/04/23 203 lb 12.8 oz (92.4 kg)  01/20/22 196 lb 3.2 oz (89 kg)  11/22/21 195 lb 3.2 oz (88.5 kg)   General appearance: alert, no distress, WD/WN, African American male, muscular build Skin: unremarkable, tattoos left upper arm, right volar wrist, no worrisome lesions HEENT: normocephalic, conjunctiva/corneas normal, sclerae anicteric, PERRLA, EOMi, nares patent, no discharge or erythema, pharynx normal Oral cavity: MMM, tongue normal, teeth normal Neck: supple, no lymphadenopathy, no thyromegaly, no masses, normal  ROM, no bruits Chest: non tender, normal shape and expansion Heart: RRR, normal S1, S2, no murmurs Lungs: CTA bilaterally, no wheezes, rhonchi, or rales Abdomen: +bs, soft, non tender, non distended, no masses, no hepatomegaly, no splenomegaly, no bruits Back: non tender, normal ROM, no scoliosis Musculoskeletal: upper extremities non tender, no obvious deformity, normal ROM throughout, lower extremities non tender, no obvious deformity, normal ROM throughout Extremities: no edema, no cyanosis, no clubbing Pulses: 2+ symmetric, upper and lower extremities, normal cap refill Neurological: alert, oriented x 3, CN2-12 intact, strength normal upper  extremities and lower extremities, sensation normal throughout, DTRs 2+ throughout, no cerebellar signs, gait normal Psychiatric: normal affect, behavior normal, pleasant  GU: normal male external genitalia,circumcised, nontender, no masses, no hernia, no lymphadenopathy Rectal: deferred   Assessment and Plan :   Encounter Diagnoses  Name Primary?   Encounter for health maintenance examination in adult Yes   Screening for hematuria or proteinuria    Encounter for lipid screening for cardiovascular disease    Vaccine counseling     This visit was a preventative care visit, also known as wellness visit or routine physical.   Topics typically include healthy lifestyle, diet, exercise, preventative care, vaccinations, sick and well care, proper use of emergency dept and after hours care, as well as other concerns.     Recommendations: Continue to return yearly for your annual wellness and preventative care visits.  This gives [place] a chance to discuss healthy lifestyle, exercise, vaccinations, review your chart record, and perform screenings where appropriate.  I recommend you see your dentist yearly for routine dental care including hygiene visits twice yearly.   Vaccination recommendations were reviewed Immunization History  Administered Date(s) Administered   Influenza,inj,Quad PF,6+ Mos 01/20/2022   Tdap 10/31/2014, 12/06/2021   Declines flu shot    Screening for cancer: Colon cancer screening: Age 60  Testicular cancer screening You should do a monthly self testicular exam if you are between 36-31 years old  We discussed PSA, prostate exam, and prostate cancer screening risks/benefits.   Age 58  Skin cancer screening: Check your skin regularly for new changes, growing lesions, or other lesions of concern Come in for evaluation if you have skin lesions of concern.  Lung cancer screening: If you have a greater than 20 pack year history of tobacco use, then you may qualify  for lung cancer screening with a chest CT scan.   Please call your insurance company to inquire about coverage for this test.  We currently don't have screenings for other cancers besides breast, cervical, colon, and lung cancers.  If you have a strong family history of cancer or have other cancer screening concerns, please let me know.    Bone health: Get at least 150 minutes of aerobic exercise weekly Get weight bearing exercise at least once weekly Bone density test:  A bone density test is an imaging test that uses a type of X-ray to measure the amount of calcium and other minerals in your bones. The test may be used to diagnose or screen you for a condition that causes weak or thin bones (osteoporosis), predict your risk for a broken bone (fracture), or determine how well your osteoporosis treatment is working. The bone density test is recommended for females 65 and older, or females or males <65 if certain risk factors such as thyroid disease, long term use of steroids such as for asthma or rheumatological issues, vitamin D deficiency, estrogen deficiency, family history of osteoporosis, self or  family history of fragility fracture in first degree relative.    Heart health: Get at least 150 minutes of aerobic exercise weekly Limit alcohol It is important to maintain a healthy blood pressure and healthy cholesterol numbers  Heart disease screening: Screening for heart disease includes screening for blood pressure, fasting lipids, glucose/diabetes screening, BMI height to weight ratio, reviewed of smoking status, physical activity, and diet.    Goals include blood pressure 120/80 or less, maintaining a healthy lipid/cholesterol profile, preventing diabetes or keeping diabetes numbers under good control, not smoking or using tobacco products, exercising most days per week or at least 150 minutes per week of exercise, and eating healthy variety of fruits and vegetables, healthy oils, and  avoiding unhealthy food choices like fried food, fast food, high sugar and high cholesterol foods.    Other tests may possibly include EKG test, CT coronary calcium score, echocardiogram, exercise treadmill stress test.     Medical care options: I recommend you continue to seek care here first for routine care.  We try really hard to have available appointments Monday through Friday daytime hours for sick visits, acute visits, and physicals.  Urgent care should be used for after hours and weekends for significant issues that cannot wait till the next day.  The emergency department should be used for significant potentially life-threatening emergencies.  The emergency department is expensive, can often have long wait times for less significant concerns, so try to utilize primary care, urgent care, or telemedicine when possible to avoid unnecessary trips to the emergency department.  Virtual visits and telemedicine have been introduced since the pandemic started in 2020, and can be convenient ways to receive medical care.  We offer virtual appointments as well to assist you in a variety of options to seek medical care.    Jerauld was seen today for annual exam.  Diagnoses and all orders for this visit:  Encounter for health maintenance examination in adult -     POCT Urinalysis DIP (Proadvantage Device) -     Comprehensive metabolic panel -     CBC -     Lipid panel  Screening for hematuria or proteinuria -     POCT Urinalysis DIP (Proadvantage Device)  Encounter for lipid screening for cardiovascular disease -     Lipid panel  Vaccine counseling    Follow-up pending labs, yearly for physical

## 2023-02-05 LAB — COMPREHENSIVE METABOLIC PANEL
ALT: 23 [IU]/L (ref 0–44)
AST: 17 [IU]/L (ref 0–40)
Albumin: 4.7 g/dL (ref 4.3–5.2)
Alkaline Phosphatase: 43 IU/L — ABNORMAL LOW (ref 44–121)
BUN/Creatinine Ratio: 13 (ref 9–20)
BUN: 16 mg/dL (ref 6–20)
Bilirubin Total: 1.2 mg/dL (ref 0.0–1.2)
CO2: 22 mmol/L (ref 20–29)
Calcium: 10 mg/dL (ref 8.7–10.2)
Chloride: 105 mmol/L (ref 96–106)
Creatinine, Ser: 1.26 mg/dL (ref 0.76–1.27)
Globulin, Total: 2.6 g/dL (ref 1.5–4.5)
Glucose: 81 mg/dL (ref 70–99)
Potassium: 4.3 mmol/L (ref 3.5–5.2)
Sodium: 142 mmol/L (ref 134–144)
Total Protein: 7.3 g/dL (ref 6.0–8.5)
eGFR: 81 mL/min/{1.73_m2} (ref >59)

## 2023-02-05 LAB — CBC
Hematocrit: 45.3 % (ref 37.5–51.0)
Hemoglobin: 15.3 g/dL (ref 13.0–17.7)
MCH: 31.3 pg (ref 26.6–33.0)
MCHC: 33.8 g/dL (ref 31.5–35.7)
MCV: 93 fL (ref 79–97)
Platelets: 241 10*3/uL (ref 150–450)
RBC: 4.89 x10E6/uL (ref 4.14–5.80)
RDW: 12.3 % (ref 11.6–15.4)
WBC: 3.5 10*3/uL (ref 3.4–10.8)

## 2023-02-05 LAB — LIPID PANEL
Chol/HDL Ratio: 3.3 ratio (ref 0.0–5.0)
Cholesterol, Total: 186 mg/dL (ref 100–199)
HDL: 56 mg/dL (ref >39)
LDL Chol Calc (NIH): 118 mg/dL — ABNORMAL HIGH (ref 0–99)
Triglycerides: 65 mg/dL (ref 0–149)
VLDL Cholesterol Cal: 12 mg/dL (ref 5–40)

## 2023-02-05 NOTE — Progress Notes (Signed)
Results sent through MyChart

## 2023-07-08 ENCOUNTER — Encounter: Payer: 59 | Admitting: Medical

## 2023-10-16 ENCOUNTER — Encounter: Payer: Self-pay | Admitting: Medical

## 2023-10-16 ENCOUNTER — Ambulatory Visit (INDEPENDENT_AMBULATORY_CARE_PROVIDER_SITE_OTHER): Admitting: Medical

## 2023-10-16 VITALS — BP 110/76 | HR 83 | Ht 71.0 in | Wt 208.4 lb

## 2023-10-16 DIAGNOSIS — Z579 Occupational exposure to unspecified risk factor: Secondary | ICD-10-CM

## 2023-10-16 DIAGNOSIS — Z7185 Encounter for immunization safety counseling: Secondary | ICD-10-CM | POA: Diagnosis not present

## 2023-10-16 DIAGNOSIS — Z0000 Encounter for general adult medical examination without abnormal findings: Secondary | ICD-10-CM

## 2023-10-16 DIAGNOSIS — Z113 Encounter for screening for infections with a predominantly sexual mode of transmission: Secondary | ICD-10-CM | POA: Diagnosis not present

## 2023-10-16 LAB — LIPID PANEL

## 2023-10-16 NOTE — Progress Notes (Signed)
 Subjective:   HPI  Earl Allen is a 28 y.o. male who presents for Chief Complaint  Patient presents with   Annual Exam    Fasting cpe,no concerns    Patient Care Team: Massie Cogliano, Alm GORMAN RIGGERS as PCP - General (Family Medicine) Sees dentist Eye doctor - none  Concerns: None, doing okay   Reviewed their medical, surgical, family, social, medication, and allergy history and updated chart as appropriate.  Past Medical History:  Diagnosis Date   History of kidney stones 2016   Left ankle sprain 2016   2019   Redundant prepuce 07/2021    Past Surgical History:  Procedure Laterality Date   CIRCUMCISION N/A 08/20/2021   Procedure: CIRCUMCISION ADULT;  Surgeon: Twylla Glendia BROCKS, MD;  Location: [facility];  Service: Urology;  Laterality: N/A;    Family History  Problem Relation Age of Onset   Cancer Neg Hx    Heart disease Neg Hx    Diabetes Neg Hx    Stroke Neg Hx     No current outpatient medications on file.  No Known Allergies  Review of Systems  Constitutional:  Negative for chills, fever, malaise/fatigue and weight loss.  HENT:  Negative for congestion, ear pain, hearing loss, sore throat and tinnitus.   Eyes:  Negative for blurred vision, pain and redness.  Respiratory:  Negative for cough, hemoptysis and shortness of breath.   Cardiovascular:  Negative for chest pain, palpitations, orthopnea, claudication and leg swelling.  Gastrointestinal:  Negative for abdominal pain, blood in stool, constipation, diarrhea, nausea and vomiting.  Genitourinary:  Negative for dysuria, flank pain, frequency, hematuria and urgency.  Musculoskeletal:  Negative for falls, joint pain and myalgias.  Skin:  Negative for itching and rash.  Neurological:  Negative for dizziness, tingling, speech change, weakness and headaches.  Endo/Heme/Allergies:  Negative for polydipsia. Does not bruise/bleed easily.  Psychiatric/Behavioral:  Negative for depression and memory loss. The patient is  not nervous/anxious and does not have insomnia.         10/16/2023    8:42 AM 02/04/2023    9:19 AM 11/22/2021    1:30 PM 03/23/2019   11:59 AM 02/23/2019    2:37 PM  Depression screen PHQ 2/9  Decreased Interest 0 0 0 2 2  Down, Depressed, Hopeless 0 0 0 0 2  PHQ - 2 Score 0 0 0 2 4  Altered sleeping    2 3  Tired, decreased energy    3 3  Change in appetite    0 3  Feeling bad or failure about yourself     1 3  Trouble concentrating    3 3  Moving slowly or fidgety/restless    0 2  Suicidal thoughts    0 0  PHQ-9 Score    11 21  Difficult doing work/chores    Not difficult at all Somewhat difficult     Objective:  BP 110/76   Pulse 83   Ht 5' 11 (1.803 m)   Wt 208 lb 6.4 oz (94.5 kg)   BMI 29.07 kg/m   Wt Readings from Last 3 Encounters:  10/16/23 208 lb 6.4 oz (94.5 kg)  02/04/23 203 lb 12.8 oz (92.4 kg)  01/20/22 196 lb 3.2 oz (89 kg)    General appearance: alert, no distress, WD/WN, African American male, muscular build Skin: unremarkable, tattoos left upper arm, right volar wrist, no worrisome lesions HEENT: normocephalic, conjunctiva/corneas normal, sclerae anicteric, PERRLA, EOMi, nares patent, no discharge or erythema,  pharynx normal Oral cavity: MMM, tongue normal, teeth normal Neck: supple, no lymphadenopathy, no thyromegaly, no masses, normal ROM, no bruits Chest: non tender, normal shape and expansion Heart: RRR, normal S1, S2, no murmurs Lungs: CTA bilaterally, no wheezes, rhonchi, or rales Abdomen: +bs, soft, non tender, non distended, no masses, no hepatomegaly, no splenomegaly, no bruits Back: non tender, normal ROM, no scoliosis Musculoskeletal: upper extremities non tender, no obvious deformity, normal ROM throughout, lower extremities non tender, no obvious deformity, normal ROM throughout Extremities: no edema, no cyanosis, no clubbing Pulses: 2+ symmetric, upper and lower extremities, normal cap refill Neurological: alert, oriented x 3, CN2-12  intact, strength normal upper extremities and lower extremities, sensation normal throughout, DTRs 2+ throughout, no cerebellar signs, gait normal Psychiatric: normal affect, behavior normal, pleasant  GU: normal male external genitalia,circumcised, nontender, no masses, no hernia, no lymphadenopathy Rectal: deferred    Assessment and Plan :   Encounter Diagnoses  Name Primary?   Encounter for health maintenance examination in adult Yes   Vaccine counseling    Occupational exposure in workplace    Screen for STD (sexually transmitted disease)      This visit was a preventative care visit, also known as wellness visit or routine physical.   Topics typically include healthy lifestyle, diet, exercise, preventative care, vaccinations, sick and well care, proper use of emergency dept and after hours care, as well as other concerns.     Recommendations: Continue to return yearly for your annual wellness and preventative care visits.  This gives us  a chance to discuss healthy lifestyle, exercise, vaccinations, review your chart record, and perform screenings where appropriate.  I recommend you see your dentist yearly for routine dental care including hygiene visits twice yearly.   Vaccination recommendations were reviewed Immunization History  Administered Date(s) Administered   Influenza,inj,Quad PF,6+ Mos 01/20/2022   Tdap 10/31/2014, 12/06/2021   Declines flu shot   Screening for cancer: Colon cancer screening: Age 62  Testicular cancer screening You should do a monthly self testicular exam if you are between 65-41 years old  We discussed PSA, prostate exam, and prostate cancer screening risks/benefits.   Age 35  Skin cancer screening: Check your skin regularly for new changes, growing lesions, or other lesions of concern Come in for evaluation if you have skin lesions of concern.  Lung cancer screening: If you have a greater than 20 pack year history of tobacco use,  then you may qualify for lung cancer screening with a chest CT scan.   Please call your insurance company to inquire about coverage for this test.  We currently don't have screenings for other cancers besides breast, cervical, colon, and lung cancers.  If you have a strong family history of cancer or have other cancer screening concerns, please let me know.    Bone health: Get at least 150 minutes of aerobic exercise weekly Get weight bearing exercise at least once weekly Bone density test:  A bone density test is an imaging test that uses a type of X-ray to measure the amount of calcium and other minerals in your bones. The test may be used to diagnose or screen you for a condition that causes weak or thin bones (osteoporosis), predict your risk for a broken bone (fracture), or determine how well your osteoporosis treatment is working. The bone density test is recommended for females 65 and older, or females or males <65 if certain risk factors such as thyroid disease, long term use of steroids such  as for asthma or rheumatological issues, vitamin D deficiency, estrogen deficiency, family history of osteoporosis, self or family history of fragility fracture in first degree relative.    Heart health: Get at least 150 minutes of aerobic exercise weekly Limit alcohol It is important to maintain a healthy blood pressure and healthy cholesterol numbers  Heart disease screening: Screening for heart disease includes screening for blood pressure, fasting lipids, glucose/diabetes screening, BMI height to weight ratio, reviewed of smoking status, physical activity, and diet.    Goals include blood pressure 120/80 or less, maintaining a healthy lipid/cholesterol profile, preventing diabetes or keeping diabetes numbers under good control, not smoking or using tobacco products, exercising most days per week or at least 150 minutes per week of exercise, and eating healthy variety of fruits and vegetables,  healthy oils, and avoiding unhealthy food choices like fried food, fast food, high sugar and high cholesterol foods.    Other tests may possibly include EKG test, CT coronary calcium score, echocardiogram, exercise treadmill stress test.     Medical care options: I recommend you continue to seek care here first for routine care.  We try really hard to have available appointments Monday through Friday daytime hours for sick visits, acute visits, and physicals.  Urgent care should be used for after hours and weekends for significant issues that cannot wait till the next day.  The emergency department should be used for significant potentially life-threatening emergencies.  The emergency department is expensive, can often have long wait times for less significant concerns, so try to utilize primary care, urgent care, or telemedicine when possible to avoid unnecessary trips to the emergency department.  Virtual visits and telemedicine have been introduced since the pandemic started in 2020, and can be convenient ways to receive medical care.  We offer virtual appointments as well to assist you in a variety of options to seek medical care.    Reuven was seen today for annual exam.  Diagnoses and all orders for this visit:  Encounter for health maintenance examination in adult -     CBC -     Comprehensive metabolic panel with GFR -     Lipid panel -     HIV Antibody (routine testing w rflx) -     RPR -     GC/Chlamydia Probe Amp -     Hepatitis C antibody  Vaccine counseling  Occupational exposure in workplace -     HIV Antibody (routine testing w rflx) -     RPR -     GC/Chlamydia Probe Amp -     Hepatitis C antibody  Screen for STD (sexually transmitted disease) -     HIV Antibody (routine testing w rflx) -     RPR -     GC/Chlamydia Probe Amp -     Hepatitis C antibody     Follow-up pending labs, yearly for physical

## 2023-10-17 LAB — RPR: RPR Ser Ql: NONREACTIVE

## 2023-10-17 LAB — COMPREHENSIVE METABOLIC PANEL WITH GFR
ALT: 29 IU/L (ref 0–44)
AST: 21 IU/L (ref 0–40)
Albumin: 4.6 g/dL (ref 4.3–5.2)
Alkaline Phosphatase: 41 IU/L — ABNORMAL LOW (ref 44–121)
BUN/Creatinine Ratio: 13 (ref 9–20)
BUN: 15 mg/dL (ref 6–20)
Bilirubin Total: 0.8 mg/dL (ref 0.0–1.2)
CO2: 20 mmol/L (ref 20–29)
Calcium: 9.9 mg/dL (ref 8.7–10.2)
Chloride: 103 mmol/L (ref 96–106)
Creatinine, Ser: 1.14 mg/dL (ref 0.76–1.27)
Globulin, Total: 2.5 g/dL (ref 1.5–4.5)
Glucose: 89 mg/dL (ref 70–99)
Potassium: 4.1 mmol/L (ref 3.5–5.2)
Sodium: 139 mmol/L (ref 134–144)
Total Protein: 7.1 g/dL (ref 6.0–8.5)
eGFR: 90 mL/min/1.73 (ref >59)

## 2023-10-17 LAB — CBC
Hematocrit: 45.7 % (ref 37.5–51.0)
Hemoglobin: 15.2 g/dL (ref 13.0–17.7)
MCH: 31.7 pg (ref 26.6–33.0)
MCHC: 33.3 g/dL (ref 31.5–35.7)
MCV: 95 fL (ref 79–97)
Platelets: 220 x10E3/uL (ref 150–450)
RBC: 4.79 x10E6/uL (ref 4.14–5.80)
RDW: 13.1 % (ref 11.6–15.4)
WBC: 4.3 x10E3/uL (ref 3.4–10.8)

## 2023-10-17 LAB — HEPATITIS C ANTIBODY: Hep C Virus Ab: NONREACTIVE

## 2023-10-17 LAB — HIV ANTIBODY (ROUTINE TESTING W REFLEX): HIV Screen 4th Generation wRfx: NONREACTIVE

## 2023-10-17 LAB — LIPID PANEL
Chol/HDL Ratio: 3.3 ratio (ref 0.0–5.0)
Cholesterol, Total: 194 mg/dL (ref 100–199)
HDL: 58 mg/dL (ref >39)
LDL Chol Calc (NIH): 122 mg/dL — ABNORMAL HIGH (ref 0–99)
Triglycerides: 75 mg/dL (ref 0–149)
VLDL Cholesterol Cal: 14 mg/dL (ref 5–40)

## 2023-10-18 LAB — GC/CHLAMYDIA PROBE AMP
Chlamydia trachomatis, NAA: NEGATIVE
Neisseria Gonorrhoeae by PCR: NEGATIVE

## 2023-10-19 ENCOUNTER — Ambulatory Visit: Payer: Self-pay | Admitting: Medical

## 2023-10-19 NOTE — Progress Notes (Signed)
  Your labs shows normal blood sugar, liver, kidney and electrolytes, and normal.  Negative for hepatitis C, negative hepatitis B, negative for HIV, negative for gonorrhea and chlamydia.  Your bad cholesterol LDL is about the same as it has been.  I would limit high cholesterol foods.    Recommendations for improving lipids:  Foods TO AVOID or limit - fried foods, high sugar foods, white bread, enriched flour, fast food, red meat, large amounts of cheese, processed foods such as little debbie cakes, cookies, pies, donuts, for example  Foods TO INCLUDE in the diet - whole grains such as whole grain pasta, whole grain bread, barley, steel cut oatmeal (not instant oatmeal), avocado, fish, green leafy vegetables, nuts, increased fiber in diet, and using olive oil in small amounts for cooking or as salad dressing vinaigrette.    Continue routine exercise, and glad to see you are healthy.  Have a great day! Ludie

## 2024-03-08 ENCOUNTER — Encounter: Payer: Self-pay | Admitting: Family Medicine

## 2024-03-08 ENCOUNTER — Ambulatory Visit: Admitting: Family Medicine

## 2024-03-08 ENCOUNTER — Ambulatory Visit: Payer: Self-pay | Admitting: *Deleted

## 2024-03-08 VITALS — BP 114/70 | HR 82 | Temp 100.2°F | Ht 71.0 in | Wt 213.2 lb

## 2024-03-08 DIAGNOSIS — R509 Fever, unspecified: Secondary | ICD-10-CM

## 2024-03-08 DIAGNOSIS — R051 Acute cough: Secondary | ICD-10-CM

## 2024-03-08 DIAGNOSIS — J101 Influenza due to other identified influenza virus with other respiratory manifestations: Secondary | ICD-10-CM | POA: Diagnosis not present

## 2024-03-08 LAB — POC COVID19/FLU A&B COMBO
Covid Antigen, POC: NEGATIVE
Influenza A Antigen, POC: POSITIVE — AB
Influenza B Antigen, POC: NEGATIVE

## 2024-03-08 NOTE — Progress Notes (Signed)
" ° °  Subjective:    Patient ID: Earl Allen, male    DOB: 20-Nov-1995, 28 y.o.   MRN: [id number]  Discussed the use of AI scribe software for clinical note transcription with the patient, who gave verbal consent to proceed.  History of Present Illness   A 28 year old male presents with flu-like symptoms.  He has been experiencing extreme fatigue since Saturday, which has progressively worsened. Over the weekend, he felt 'super tired' and described feeling 'depleted' after work on Monday.  Last night, he began experiencing additional symptoms, including fever, runny nose, and general malaise. No chills, sore throat, or cough were present until last night. He has not checked his temperature today as he does not own a thermometer.  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           Review of Systems     Objective:    Physical Exam Alert alert and in no distress otherwise not examined Testing positive for influenza A           Assessment & Plan:     Acute influenza infection Symptoms persisted over 72 hours, rendering Tamiflu ineffective. - Advised to stay home until fever-free for 24 hours without antipyretics. - Recommended obtaining a thermometer to monitor temperature. - Instructed to avoid work if symptomatic.        "

## 2024-03-08 NOTE — Progress Notes (Deleted)
 [id number]

## 2024-03-08 NOTE — Telephone Encounter (Signed)
 FYI Only or Action Required?: FYI only for provider: appointment scheduled on 03/08/24.  Patient was last seen in primary care on 10/16/2023 by Bulah Alm RAMAN, PA-C.  Called Nurse Triage reporting Cough. Suspect possible covid   Symptoms began yesterday. Last night   Interventions attempted: Rest, hydration, or home remedies.  Symptoms are: gradually worsening.  Triage Disposition: Home Care  Patient/caregiver understands and will follow disposition?: Yes    Patient does not have access to covid test. Appt scheduled today for possible testing for covid. Please advise if appt needed or if other recommendations for sx.              Copied from CRM #[id number]. Topic: Clinical - Red Word Triage >> Mar 08, 2024  9:58 AM Treva T wrote: [facility] that prompted transfer to Nurse Triage: Pt Calling states last night experienced increased coughing with pain in chest from coughing, with increased nasal and chest congestion, sinus pressure and pain, and increased body weakness and fatigue.   Coughing up thick mucous.   Onset of symptoms last night.   Pt requesting an appt for evaluation.   Pt ph [phone] Reason for Disposition  [1] COVID-19 infection suspected AND [2] mild symptoms (cough, fever, or others) AND [3] has not gotten tested yet  Answer Assessment - Initial Assessment Questions Requesting recommendations for sx. Recommended testing for covid / flu. Reports sx started suddenly last night. Please advise if to keep appt or if other recommendations per PCP.       1. ONSET: When did the cough begin?      Last night  2. SEVERITY: How bad is the cough today?      Causing chest discomfort feels in chest  3. SPUTUM: Describe the color of your sputum (e.g., none, dry cough; clear, white, yellow, green)     Yellow  4. HEMOPTYSIS: Are you coughing up any blood? If Yes, ask: How much? (e.g., flecks, streaks, tablespoons, etc.)     na 5. DIFFICULTY BREATHING:  Are you having difficulty breathing? If Yes, ask: How bad is it? (e.g., mild, moderate, severe)      no 6. FEVER: Do you have a fever? If Yes, ask: What is your temperature, how was it measured, and when did it start?     na 7. CARDIAC HISTORY: Do you have any history of heart disease? (e.g., heart attack, congestive heart failure)      Na  8. LUNG HISTORY: Do you have any history of lung disease?  (e.g., pulmonary embolus, asthma, emphysema)     Na  9. PE RISK FACTORS: Do you have a history of blood clots? (or: recent major surgery, recent prolonged travel, bedridden)     Na  10. OTHER SYMPTOMS: Do you have any other symptoms? (e.g., runny nose, wheezing, chest pain)       Runny nose, cough yellow sputum, chest discomfort coughing, sore throat, headache, sore eyes, loss of smell 11. PREGNANCY: Is there any chance you are pregnant? When was your last menstrual period?       na 12. TRAVEL: Have you traveled out of the country in the last month? (e.g., travel history, exposures)       na  Answer Assessment - Initial Assessment Questions Appt scheduled today for testing.       1. SYMPTOMS: What is your main symptom or concern? (e.g., cough, fever, shortness of breath, muscle aches)     Cough, sore throat, headache, loss of smell 2. ONSET:  When did the symptoms start?      Last night  3. COUGH: Do you have a cough? If Yes, ask: How bad is the cough?       Yes productive yellow  4. FEVER: Do you have a fever? If Yes, ask: What is your temperature, how was it measured, and when did it start?     Na  5. BREATHING DIFFICULTY: Are you having any difficulty breathing? (e.g., normal; shortness of breath, wheezing, unable to speak)      normal 6. BETTER-SAME-WORSE: Are you getting better, staying the same or getting worse compared to yesterday?  If getting worse, ask, In what way?     worse 7. OTHER SYMPTOMS: Do you have any other symptoms?  (e.g.,  chills, fatigue, headache, loss of smell or taste, muscle pain, sore throat)     Loss of smell, headache, sore throat, cough pain in chest with coughing. Sore eyes  8. COVID-19 DIAGNOSIS: How do you know that you have COVID? (e.g., positive lab test or self-test, diagnosed by doctor or NP/PA, symptoms after exposure).     Unsure  9. COVID-19 EXPOSURE: Was there any known exposure to COVID before the symptoms began?      No  10. COVID-19 VACCINE: Have you had the COVID-19 vaccine? If Yes, ask: When did you last get it?       na 11. HIGH RISK DISEASE: Do you have any chronic medical problems? (e.g., asthma, heart or lung disease, weak immune system, obesity, etc.)       na 12. PREGNANCY: Is there any chance you are pregnant? When was your last menstrual period?       na 13. O2 SATURATION MONITOR:  Do you use an oxygen saturation monitor (pulse oximeter) at home? If Yes, ask What is your reading (oxygen level) today? What is your usual oxygen saturation reading? (e.g., 95%)       na  Protocols used: Cough - Acute Productive-A-AH, COVID-19 - Diagnosed or Suspected-A-AH

## 2024-10-25 ENCOUNTER — Encounter: Payer: Self-pay | Admitting: Medical
